# Patient Record
Sex: Male | Born: 1985 | Race: Black or African American | Hispanic: No | Marital: Single | Smoking: Never smoker
Health system: Southern US, Community
[De-identification: ages and names within clinical notes are randomized; demographics above are authoritative.]

## PROBLEM LIST (undated history)

## (undated) ENCOUNTER — Emergency Department (HOSPITAL_COMMUNITY): Admission: EM | Payer: 59 | Source: Home / Self Care

---
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
## 2006-05-13 ENCOUNTER — Emergency Department (HOSPITAL_COMMUNITY): Admission: EM | Admit: 2006-05-13 | Discharge: 2006-05-13 | Payer: Self-pay | Admitting: Emergency Medicine

## 2008-11-30 ENCOUNTER — Emergency Department (HOSPITAL_COMMUNITY): Admission: EM | Admit: 2008-11-30 | Discharge: 2008-11-30 | Payer: Self-pay | Admitting: Emergency Medicine

## 2009-09-08 ENCOUNTER — Emergency Department (HOSPITAL_COMMUNITY): Admission: EM | Admit: 2009-09-08 | Discharge: 2009-09-08 | Payer: Self-pay | Admitting: Emergency Medicine

## 2009-11-08 ENCOUNTER — Ambulatory Visit: Payer: Self-pay | Admitting: Family Medicine

## 2009-11-08 ENCOUNTER — Inpatient Hospital Stay (HOSPITAL_COMMUNITY): Admission: EM | Admit: 2009-11-08 | Discharge: 2009-11-11 | Payer: Self-pay | Admitting: Emergency Medicine

## 2009-11-09 ENCOUNTER — Ambulatory Visit: Payer: Self-pay | Admitting: Internal Medicine

## 2009-11-26 ENCOUNTER — Ambulatory Visit: Payer: Self-pay | Admitting: Family Medicine

## 2009-11-26 DIAGNOSIS — D509 Iron deficiency anemia, unspecified: Secondary | ICD-10-CM | POA: Insufficient documentation

## 2009-11-26 DIAGNOSIS — K260 Acute duodenal ulcer with hemorrhage: Secondary | ICD-10-CM | POA: Insufficient documentation

## 2010-06-18 NOTE — Assessment & Plan Note (Signed)
Summary: h/fup,tcb   Vital Signs:  Patient profile:   25 year old male Height:      69.75 inches Weight:      170 pounds BMI:     24.66 Temp:     98.6 degrees F oral Pulse rate:   83 / minute BP sitting:   124 / 74  (left arm) Cuff size:   regular  Vitals Entered By: Tessie Fass CMA (November 26, 2009 2:28 PM) CC: hospital f/u Is Patient Diabetic? No Pain Assessment Patient in pain? no        Primary Care Provider:  Clementeen Gonzalez  CC:  hospital f/u.  History of Present Illness: Bruce Gonzalez presents for hospital follow up and a new patient visit. He was hospitilized for a bleeding duodenal ulcer that resulted in iron deficiency anemia. In the interum he has done well. He has been taking his omeprazole and carafate as directed.  He no longer has abdominal pain and his melena has resolved.  He also notes that his dyspnea on exertion has improved as well.  He has returned to work and feels back to his normal self. He no longer drinks alcohol.    Habits & Providers  Alcohol-Tobacco-Diet     Alcohol drinks/day: 0     Tobacco Status: never  Current Problems (verified): 1)  Acut Duod Ulcer W/hemorr w/o Mention Obstruction  (ICD-532.00) 2)  Anemia, Iron Deficiency  (ICD-280.9)  Current Medications (verified): 1)  Omeprazole 20 Mg Cpdr (Omeprazole) .Marland Kitchen.. 1 Pill By Mouth Daily 2)  Iron 325 (65 Fe) Mg Tabs (Ferrous Sulfate) .Marland Kitchen.. 1 By Mouth Bid  Allergies (verified): No Known Drug Allergies  Past History:  Past Medical History: Duodenal Ulcer - H.Pylori neg 10/2009 Anemia due to blood loss with ulcer 10/2009  Past Surgical History: UGD to diagnose duodenal ulcer 10/2009  Family History: Father with diabetes as an adult.  Social History: No alcohol any longer. No tobacco. Works at [employer] and trying to sign up for [employer]Smoking Status:  never  Review of Systems  The patient denies anorexia, fever, weight loss, hoarseness, chest pain, syncope, dyspnea on  exertion, peripheral edema, headaches, hemoptysis, abdominal pain, melena, hematochezia, severe indigestion/heartburn, and difficulty walking.    Physical Exam  General:  VS noted. Well appearing male in NAD Eyes:  No corneal or conjunctival inflammation noted. EOMI. Perrla.  Mouth:  Oral mucosa and oropharynx without lesions or exudates.  Teeth in good repair. Lungs:  Normal respiratory effort, chest expands symmetrically. Lungs are clear to auscultation, no crackles or wheezes. Heart:  Normal rate and regular rhythm. S1 and S2 normal without gallop, murmur, click, rub or other extra sounds. Abdomen:  Bowel sounds positive,abdomen soft and non-tender without masses, organomegaly or hernias noted. Extremities:  Non edemetus, warm and well perfused.   Impression & Recommendations:  Problem # 1:  ACUT DUOD ULCER W/HEMORR W/O MENTION OBSTRUCTION (ICD-532.00) Assessment Improved Improved from hospitilization. Asymptomatic. Taking medications as directed. Has stoped drinking alcohol.  Plan to change to daily omeprazole for the next few monhts and encourage alcohol moderation.  Will f/u in 6 months.   His updated medication list for this problem includes:    Omeprazole 20 Mg Cpdr (Omeprazole) .Marland Kitchen... 1 pill by mouth daily  Problem # 2:  ANEMIA, IRON DEFICIENCY (ICD-280.9) Assessment: Improved Hb at 9.2 up from 8.0 at discharge.  Plan to start two times a day IRON today.  Will stop after 3months. Will f/u in 6 months with  repeat Hb or CBC.   His updated medication list for this problem includes:    Iron 325 (65 Fe) Mg Tabs (Ferrous sulfate) .Marland Kitchen... 1 by mouth bid  Orders: [facility] ([address])  Complete Medication List: 1)  Omeprazole 20 Mg Cpdr (Omeprazole) .Marland Kitchen.. 1 pill by mouth daily 2)  Iron 325 (65 Fe) Mg Tabs (Ferrous sulfate) .Marland Kitchen.. 1 by mouth bid  Patient Instructions: 1)  Thank you for seeing me today. 2)  I will call you with results. [phone] and leave a message. 3)  Take  omeprazole 1x daily. 4)  Take iron twice a day for 3 months. 5)  You may use colace (stool softner) for constipation with iron. 6)  Make an appointment to see me in 6 months. Prescriptions: OMEPRAZOLE 20 MG CPDR (OMEPRAZOLE) 1 pill by mouth daily  #30 x 6   Entered and Authorized by:   Bruce Graham MD   Signed by:   Bruce Graham MD on 11/26/2009   Method used:   Print then Give to Patient   RxID:   [id number]   Laboratory Results   Blood Tests   Date/Time Received: November 26, 2009 2:55 PM  Date/Time Reported: November 26, 2009 2:57 PM     CBC   HGB:  9.2 g/dL   (Normal Range: 78.4-69.6 in Males, 12.0-15.0 in Females) Comments: ...........test performed by...........Marland KitchenTerese Door, CMA

## 2010-06-18 NOTE — Procedures (Signed)
Summary: Upper Endoscopy  Patient: [name] Note: All result statuses are Final unless otherwise noted.  Tests: (1) Upper Endoscopy (EGD)   EGD Upper Endoscopy       DONE     [facility]     [address]     [place], [place]  [address]           ENDOSCOPY PROCEDURE REPORT           PATIENT:  Bruce Gonzalez, Bruce Gonzalez  MR#:  [id number]     BIRTHDATE:  1985/07/18, 23 yrs. old  GENDER:  male           ENDOSCOPIST:  Wilhemina Bonito. Eda Keys, MD     Referred by:  Estill Batten. Deirdre Priest, M.D.           PROCEDURE DATE:  11/09/2009     PROCEDURE:  EGD with biopsy,     EGD for control of bleeding     ASA CLASS:  Class I     INDICATIONS:  melena           MEDICATIONS:   Fentanyl 100 mcg IV, Versed 8 mg IV, Epinephrine     3.5 cc     TOPICAL ANESTHETIC:  Cetacaine Spray           DESCRIPTION OF PROCEDURE:   After the risks benefits and     alternatives of the procedure were thoroughly explained, informed     consent was obtained.  The EG-[id number] ([id number]) endoscope was     introduced through the mouth and advanced to the second portion of     the duodenum, without limitations.  The instrument was slowly     withdrawn as the mucosa was fully examined.     <<PROCEDUREIMAGES>>           The upper, middle, and distal third of the esophagus were     carefully inspected and no abnormalities were noted. The z-line     was well seen at the GEJ. The endoscope was pushed into the fundus     which was normal including a retroflexed view. The antrum and     gastric body were unremarkable.  A 15mm ulcer was found in the     bulb / descending duodenum junction. No active bleeding or obvious     stigmata, though clear evaluation of all dimentions of the ulcer     was quite difficult due to deformed anatomy, marked edema, and     angulation. THERAPY: 3.5 cc {1:10,000} epinephrine was injected     into the firm ulcer in several areas. CLO bx taken from antrum.     Retroflexed views revealed no  abnormalities.    The scope was then     withdrawn from the patient and the procedure completed.     COMPLICATIONS:  None           ENDOSCOPIC IMPRESSION:           1) Ulcer in the bulb/descending duodenum - s/p epi injection     therapy and clo bx           RECOMMENDATIONS:     1) Continue IV PPI     2) Rx CLO if positive     3) H. pylori ab     4) no NSAIDS     5) Clears           ______________________________     Wilhemina Bonito. Eda Keys,  MD           CC:  Coralee North MD, Jeani Hawking, MD           n.     eSIGNED:   Wilhemina Bonito. Eda Keys at 11/09/2009 11:21 AM           Rufina Falco, [id number]  Note: An exclamation mark (!) indicates a result that was not dispersed into the flowsheet. Document Creation Date: 11/10/2009 9:26 AM _______________________________________________________________________  (1) Order result status: Final Collection or observation date-time: 11/09/2009 11:00 Requested date-time:  Receipt date-time:  Reported date-time:  Referring Physician:   Ordering Physician: Fransico Setters [phone]) Specimen Source:  Source: Launa Grill Order Number: [id number] Lab site:

## 2010-08-02 LAB — POCT I-STAT, CHEM 8
BUN: 28 mg/dL — ABNORMAL HIGH (ref 6–23)
Calcium, Ion: 1.2 mmol/L (ref 1.12–1.32)
Chloride: 108 mEq/L (ref 96–112)
Creatinine, Ser: 1.1 mg/dL (ref 0.4–1.5)
Glucose, Bld: 86 mg/dL (ref 70–99)
HCT: 28 % — ABNORMAL LOW (ref 39.0–52.0)
Hemoglobin: 9.5 g/dL — ABNORMAL LOW (ref 13.0–17.0)
Potassium: 4 mEq/L (ref 3.5–5.1)
Sodium: 139 mEq/L (ref 135–145)
TCO2: 24 mmol/L (ref 0–100)

## 2010-08-02 LAB — BASIC METABOLIC PANEL
BUN: 5 mg/dL — ABNORMAL LOW (ref 6–23)
CO2: 26 mEq/L (ref 19–32)
Chloride: 108 mEq/L (ref 96–112)
GFR calc non Af Amer: >60 mL/min (ref >60)
Glucose, Bld: 101 mg/dL — ABNORMAL HIGH (ref 70–99)
Potassium: 3.6 mEq/L (ref 3.5–5.1)
Sodium: 141 mEq/L (ref 135–145)

## 2010-08-02 LAB — BILIRUBIN, FRACTIONATED(TOT/DIR/INDIR)
Bilirubin, Direct: 0.4 mg/dL — ABNORMAL HIGH (ref 0.0–0.3)
Indirect Bilirubin: 1.7 mg/dL — ABNORMAL HIGH (ref 0.3–0.9)
Total Bilirubin: 2.1 mg/dL — ABNORMAL HIGH (ref 0.3–1.2)

## 2010-08-02 LAB — COMPREHENSIVE METABOLIC PANEL
ALT: 18 U/L (ref 0–53)
AST: 17 U/L (ref 0–37)
AST: 20 U/L (ref 0–37)
Albumin: 3.3 g/dL — ABNORMAL LOW (ref 3.5–5.2)
Albumin: 3.8 g/dL (ref 3.5–5.2)
Alkaline Phosphatase: 52 U/L (ref 39–117)
BUN: 27 mg/dL — ABNORMAL HIGH (ref 6–23)
BUN: 5 mg/dL — ABNORMAL LOW (ref 6–23)
CO2: 25 mEq/L (ref 19–32)
Calcium: 8.5 mg/dL (ref 8.4–10.5)
Calcium: 9.3 mg/dL (ref 8.4–10.5)
Chloride: 108 mEq/L (ref 96–112)
Creatinine, Ser: 1.1 mg/dL (ref 0.4–1.5)
Creatinine, Ser: 1.12 mg/dL (ref 0.4–1.5)
GFR calc Af Amer: >60 mL/min (ref >60)
GFR calc Af Amer: >60 mL/min (ref >60)
GFR calc non Af Amer: >60 mL/min (ref >60)
Glucose, Bld: 79 mg/dL (ref 70–99)
Potassium: 3.8 mEq/L (ref 3.5–5.1)
Sodium: 138 mEq/L (ref 135–145)
Total Bilirubin: 1.4 mg/dL — ABNORMAL HIGH (ref 0.3–1.2)
Total Protein: 5.9 g/dL — ABNORMAL LOW (ref 6.0–8.3)
Total Protein: 6.8 g/dL (ref 6.0–8.3)

## 2010-08-02 LAB — DIFFERENTIAL
Eosinophils Relative: 1 % (ref 0–5)
Lymphocytes Relative: 25 % (ref 12–46)
Lymphs Abs: 3 10*3/uL (ref 0.7–4.0)
Neutro Abs: 7.5 10*3/uL (ref 1.7–7.7)

## 2010-08-02 LAB — TYPE AND SCREEN
ABO/RH(D): O POS
Antibody Screen: NEGATIVE

## 2010-08-02 LAB — CBC
HCT: 24.5 % — ABNORMAL LOW (ref 39.0–52.0)
HCT: 25.8 % — ABNORMAL LOW (ref 39.0–52.0)
Hemoglobin: 8.1 g/dL — ABNORMAL LOW (ref 13.0–17.0)
Hemoglobin: 8.3 g/dL — ABNORMAL LOW (ref 13.0–17.0)
MCH: 25.6 pg — ABNORMAL LOW (ref 26.0–34.0)
MCH: 26 pg (ref 26.0–34.0)
MCHC: 32.1 g/dL (ref 30.0–36.0)
MCHC: 32.4 g/dL (ref 30.0–36.0)
MCV: 76.9 fL — ABNORMAL LOW (ref 78.0–100.0)
MCV: 79.5 fL (ref 78.0–100.0)
MCV: 79.9 fL (ref 78.0–100.0)
Platelets: 271 10*3/uL (ref 150–400)
Platelets: 345 10*3/uL (ref 150–400)
RBC: 3.14 MIL/uL — ABNORMAL LOW (ref 4.22–5.81)
RBC: 3.18 MIL/uL — ABNORMAL LOW (ref 4.22–5.81)
RDW: 18.6 % — ABNORMAL HIGH (ref 11.5–15.5)
RDW: 18.9 % — ABNORMAL HIGH (ref 11.5–15.5)
WBC: 11.8 10*3/uL — ABNORMAL HIGH (ref 4.0–10.5)
WBC: 8.9 10*3/uL (ref 4.0–10.5)
WBC: 9.6 10*3/uL (ref 4.0–10.5)

## 2010-08-02 LAB — LIPASE, BLOOD: Lipase: 26 U/L (ref 11–59)

## 2010-08-02 LAB — CLOTEST (H. PYLORI), BIOPSY: Helicobacter screen: NEGATIVE

## 2010-08-02 LAB — APTT: aPTT: 25 seconds (ref 24–37)

## 2010-08-02 LAB — HEMOCCULT GUIAC POC 1CARD (OFFICE): Fecal Occult Bld: POSITIVE

## 2010-08-02 LAB — LACTIC ACID, PLASMA: Lactic Acid, Venous: 0.9 mmol/L (ref 0.5–2.2)

## 2010-08-02 LAB — PROTIME-INR
INR: 1.14 (ref 0.00–1.49)
Prothrombin Time: 14.5 seconds (ref 11.6–15.2)

## 2010-08-02 LAB — ABO/RH: ABO/RH(D): O POS

## 2010-08-04 LAB — COMPREHENSIVE METABOLIC PANEL
Alkaline Phosphatase: 50 U/L (ref 39–117)
BUN: 37 mg/dL — ABNORMAL HIGH (ref 6–23)
Calcium: 8.9 mg/dL (ref 8.4–10.5)
Glucose, Bld: 96 mg/dL (ref 70–99)
Total Protein: 6.9 g/dL (ref 6.0–8.3)

## 2010-08-04 LAB — CBC
HCT: 34.4 % — ABNORMAL LOW (ref 39.0–52.0)
MCHC: 35.2 g/dL (ref 30.0–36.0)
MCV: 94.5 fL (ref 78.0–100.0)
Platelets: 249 10*3/uL (ref 150–400)
WBC: 17.4 10*3/uL — ABNORMAL HIGH (ref 4.0–10.5)

## 2010-08-04 LAB — DIFFERENTIAL
Eosinophils Absolute: 0.2 10*3/uL (ref 0.0–0.7)
Eosinophils Relative: 1 % (ref 0–5)
Lymphs Abs: 1.8 10*3/uL (ref 0.7–4.0)
Monocytes Relative: 6 % (ref 3–12)

## 2010-08-04 LAB — URINALYSIS, ROUTINE W REFLEX MICROSCOPIC
Bilirubin Urine: NEGATIVE
Glucose, UA: NEGATIVE mg/dL
Hgb urine dipstick: NEGATIVE
Protein, ur: NEGATIVE mg/dL

## 2010-08-04 LAB — POCT I-STAT, CHEM 8
Calcium, Ion: 1.19 mmol/L (ref 1.12–1.32)
Chloride: 106 mEq/L (ref 96–112)
HCT: 36 % — ABNORMAL LOW (ref 39.0–52.0)
Potassium: 4.3 mEq/L (ref 3.5–5.1)

## 2010-08-04 LAB — HEMOCCULT GUIAC POC 1CARD (OFFICE): Fecal Occult Bld: POSITIVE

## 2010-08-04 LAB — LIPASE, BLOOD: Lipase: 24 U/L (ref 11–59)

## 2010-08-23 LAB — GC/CHLAMYDIA PROBE AMP, GENITAL: GC Probe Amp, Genital: NEGATIVE

## 2010-10-12 ENCOUNTER — Emergency Department (HOSPITAL_COMMUNITY)
Admission: EM | Admit: 2010-10-12 | Discharge: 2010-10-13 | Disposition: A | Discharge status: Home / Self Care | Payer: 59 | Attending: Emergency Medicine | Admitting: Emergency Medicine

## 2010-10-12 DIAGNOSIS — S8010XA Contusion of unspecified lower leg, initial encounter: Secondary | ICD-10-CM | POA: Insufficient documentation

## 2010-10-12 DIAGNOSIS — M79609 Pain in unspecified limb: Secondary | ICD-10-CM | POA: Insufficient documentation

## 2010-10-12 DIAGNOSIS — IMO0002 Reserved for concepts with insufficient information to code with codable children: Secondary | ICD-10-CM | POA: Insufficient documentation

## 2010-10-12 DIAGNOSIS — M25569 Pain in unspecified knee: Secondary | ICD-10-CM | POA: Insufficient documentation

## 2010-10-13 ENCOUNTER — Emergency Department (HOSPITAL_COMMUNITY): Payer: 59

## 2011-02-21 ENCOUNTER — Emergency Department (HOSPITAL_COMMUNITY)
Admission: EM | Admit: 2011-02-21 | Discharge: 2011-02-21 | Disposition: A | Discharge status: Home / Self Care | Payer: No Typology Code available for payment source | Attending: Emergency Medicine | Admitting: Emergency Medicine

## 2011-02-21 DIAGNOSIS — M25519 Pain in unspecified shoulder: Secondary | ICD-10-CM | POA: Insufficient documentation

## 2011-02-21 DIAGNOSIS — M62838 Other muscle spasm: Secondary | ICD-10-CM | POA: Insufficient documentation

## 2015-05-07 ENCOUNTER — Emergency Department (HOSPITAL_COMMUNITY)
Admission: EM | Admit: 2015-05-07 | Discharge: 2015-05-07 | Disposition: A | Discharge status: Home / Self Care | Payer: 59 | Attending: Emergency Medicine | Admitting: Emergency Medicine

## 2015-05-07 ENCOUNTER — Emergency Department (HOSPITAL_COMMUNITY): Payer: 59

## 2015-05-07 ENCOUNTER — Emergency Department (HOSPITAL_COMMUNITY)
Admission: EM | Admit: 2015-05-07 | Discharge: 2015-05-07 | Disposition: A | Discharge status: Nursing Home (Includes ICF) | Payer: 59 | Source: Home / Self Care | Attending: Family Medicine | Admitting: Family Medicine

## 2015-05-07 ENCOUNTER — Encounter (HOSPITAL_COMMUNITY): Payer: Self-pay | Admitting: Emergency Medicine

## 2015-05-07 DIAGNOSIS — M545 Low back pain, unspecified: Secondary | ICD-10-CM

## 2015-05-07 DIAGNOSIS — M549 Dorsalgia, unspecified: Secondary | ICD-10-CM

## 2015-05-07 DIAGNOSIS — R630 Anorexia: Secondary | ICD-10-CM | POA: Insufficient documentation

## 2015-05-07 DIAGNOSIS — G4489 Other headache syndrome: Secondary | ICD-10-CM

## 2015-05-07 DIAGNOSIS — R509 Fever, unspecified: Secondary | ICD-10-CM

## 2015-05-07 DIAGNOSIS — D729 Disorder of white blood cells, unspecified: Secondary | ICD-10-CM

## 2015-05-07 DIAGNOSIS — R531 Weakness: Secondary | ICD-10-CM | POA: Insufficient documentation

## 2015-05-07 DIAGNOSIS — R5383 Other fatigue: Secondary | ICD-10-CM | POA: Diagnosis not present

## 2015-05-07 DIAGNOSIS — R197 Diarrhea, unspecified: Secondary | ICD-10-CM | POA: Diagnosis not present

## 2015-05-07 LAB — URINALYSIS, ROUTINE W REFLEX MICROSCOPIC
Bilirubin Urine: NEGATIVE
GLUCOSE, UA: NEGATIVE mg/dL
Hgb urine dipstick: NEGATIVE
KETONES UR: 15 mg/dL — AB
LEUKOCYTES UA: NEGATIVE
NITRITE: NEGATIVE
PH: 5.5 (ref 5.0–8.0)
Protein, ur: NEGATIVE mg/dL
SPECIFIC GRAVITY, URINE: 1.027 (ref 1.005–1.030)

## 2015-05-07 LAB — COMPREHENSIVE METABOLIC PANEL
ALK PHOS: 69 U/L (ref 38–126)
ALT: 51 U/L (ref 17–63)
AST: 35 U/L (ref 15–41)
Albumin: 3.4 g/dL — ABNORMAL LOW (ref 3.5–5.0)
Anion gap: 10 (ref 5–15)
BILIRUBIN TOTAL: 1.4 mg/dL — AB (ref 0.3–1.2)
BUN: 6 mg/dL (ref 6–20)
CALCIUM: 8.8 mg/dL — AB (ref 8.9–10.3)
CO2: 21 mmol/L — ABNORMAL LOW (ref 22–32)
CREATININE: 1.19 mg/dL (ref 0.61–1.24)
Chloride: 105 mmol/L (ref 101–111)
Glucose, Bld: 85 mg/dL (ref 65–99)
Potassium: 3.7 mmol/L (ref 3.5–5.1)
Sodium: 136 mmol/L (ref 135–145)
Total Protein: 6.8 g/dL (ref 6.5–8.1)

## 2015-05-07 LAB — CBC WITH DIFFERENTIAL/PLATELET
BASOS ABS: 0 10*3/uL (ref 0.0–0.1)
BASOS PCT: 0 %
Eosinophils Absolute: 0 10*3/uL (ref 0.0–0.7)
Eosinophils Relative: 0 %
HEMATOCRIT: 47.1 % (ref 39.0–52.0)
HEMOGLOBIN: 15.7 g/dL (ref 13.0–17.0)
Lymphocytes Relative: 13 %
Lymphs Abs: 1.9 10*3/uL (ref 0.7–4.0)
MCH: 31.5 pg (ref 26.0–34.0)
MCHC: 33.3 g/dL (ref 30.0–36.0)
MCV: 94.4 fL (ref 78.0–100.0)
MONOS PCT: 11 %
Monocytes Absolute: 1.5 10*3/uL — ABNORMAL HIGH (ref 0.1–1.0)
NEUTROS ABS: 10.6 10*3/uL — AB (ref 1.7–7.7)
NEUTROS PCT: 76 %
Platelets: 185 10*3/uL (ref 150–400)
RBC: 4.99 MIL/uL (ref 4.22–5.81)
RDW: 12.3 % (ref 11.5–15.5)
WBC: 14 10*3/uL — ABNORMAL HIGH (ref 4.0–10.5)

## 2015-05-07 LAB — CBC
HEMATOCRIT: 48.2 % (ref 39.0–52.0)
HEMOGLOBIN: 16.5 g/dL (ref 13.0–17.0)
MCH: 32 pg (ref 26.0–34.0)
MCHC: 34.2 g/dL (ref 30.0–36.0)
MCV: 93.6 fL (ref 78.0–100.0)
Platelets: 182 10*3/uL (ref 150–400)
RBC: 5.15 MIL/uL (ref 4.22–5.81)
RDW: 12.2 % (ref 11.5–15.5)
WBC: 15.1 10*3/uL — ABNORMAL HIGH (ref 4.0–10.5)

## 2015-05-07 LAB — POCT I-STAT, CHEM 8
BUN: 10 mg/dL (ref 6–20)
CALCIUM ION: 1.15 mmol/L (ref 1.12–1.23)
CHLORIDE: 98 mmol/L — AB (ref 101–111)
Creatinine, Ser: 1.4 mg/dL — ABNORMAL HIGH (ref 0.61–1.24)
Glucose, Bld: 119 mg/dL — ABNORMAL HIGH (ref 65–99)
HCT: 52 % (ref 39.0–52.0)
Hemoglobin: 17.7 g/dL — ABNORMAL HIGH (ref 13.0–17.0)
POTASSIUM: 3.9 mmol/L (ref 3.5–5.1)
SODIUM: 136 mmol/L (ref 135–145)
TCO2: 27 mmol/L (ref 0–100)

## 2015-05-07 LAB — POCT URINALYSIS DIP (DEVICE)
Bilirubin Urine: NEGATIVE
GLUCOSE, UA: NEGATIVE mg/dL
Hgb urine dipstick: NEGATIVE
Ketones, ur: NEGATIVE mg/dL
LEUKOCYTES UA: NEGATIVE
Nitrite: NEGATIVE
Protein, ur: 100 mg/dL — AB
SPECIFIC GRAVITY, URINE: 1.02 (ref 1.005–1.030)
pH: 7 (ref 5.0–8.0)

## 2015-05-07 LAB — LACTIC ACID, PLASMA
LACTIC ACID, VENOUS: 1.1 mmol/L (ref 0.5–2.0)
Lactic Acid, Venous: 1 mmol/L (ref 0.5–2.0)

## 2015-05-07 LAB — POCT RAPID STREP A: STREPTOCOCCUS, GROUP A SCREEN (DIRECT): NEGATIVE

## 2015-05-07 MED ORDER — KETOROLAC TROMETHAMINE 30 MG/ML IJ SOLN: 30.0000 mg | Freq: Once | INTRAMUSCULAR | Status: DC

## 2015-05-07 MED ORDER — DIAZEPAM 5 MG PO TABS
5.0000 mg | ORAL_TABLET | Freq: Once | ORAL | Status: AC
  Administered 2015-05-07: 5 mg via ORAL
  Filled 2015-05-07: qty 1

## 2015-05-07 MED ORDER — IBUPROFEN 800 MG PO TABS: 800.0000 mg | ORAL_TABLET | Freq: Three times a day (TID) | ORAL | Status: DC

## 2015-05-07 MED ORDER — KETOROLAC TROMETHAMINE 60 MG/2ML IM SOLN: 60.0000 mg | Freq: Once | INTRAMUSCULAR | Status: DC

## 2015-05-07 MED ORDER — ACETAMINOPHEN 325 MG PO TABS
ORAL_TABLET | ORAL | Status: AC
  Filled 2015-05-07: qty 2

## 2015-05-07 MED ORDER — KETOROLAC TROMETHAMINE 30 MG/ML IJ SOLN
INTRAMUSCULAR | Status: AC
  Filled 2015-05-07: qty 1

## 2015-05-07 MED ORDER — HYDROCODONE-ACETAMINOPHEN 5-325 MG PO TABS
1.0000 | ORAL_TABLET | Freq: Once | ORAL | Status: AC
  Administered 2015-05-07: 1 via ORAL

## 2015-05-07 MED ORDER — GADOBENATE DIMEGLUMINE 529 MG/ML IV SOLN
16.0000 mL | Freq: Once | INTRAVENOUS | Status: AC | PRN
  Administered 2015-05-07: 16 mL via INTRAVENOUS

## 2015-05-07 MED ORDER — HYDROMORPHONE HCL 1 MG/ML IJ SOLN
1.0000 mg | Freq: Once | INTRAMUSCULAR | Status: AC
  Administered 2015-05-07: 1 mg via INTRAVENOUS
  Filled 2015-05-07: qty 1

## 2015-05-07 MED ORDER — SODIUM CHLORIDE 0.9 % IV SOLN
Freq: Once | INTRAVENOUS | Status: AC
  Administered 2015-05-07: 16:00:00 via INTRAVENOUS

## 2015-05-07 MED ORDER — METHOCARBAMOL 500 MG PO TABS: 500.0000 mg | ORAL_TABLET | Freq: Two times a day (BID) | ORAL | Status: DC

## 2015-05-07 MED ORDER — HYDROCODONE-ACETAMINOPHEN 5-325 MG PO TABS
ORAL_TABLET | ORAL | Status: AC
  Filled 2015-05-07: qty 1

## 2015-05-07 MED ORDER — ACETAMINOPHEN 325 MG PO TABS
650.0000 mg | ORAL_TABLET | Freq: Once | ORAL | Status: AC
  Administered 2015-05-07: 650 mg via ORAL

## 2015-05-07 NOTE — ED Provider Notes (Signed)
CSN: [id number]     Arrival date & time 05/07/15  1302 History   First MD Initiated Contact with Patient 05/07/15 1348     Chief Complaint  Patient presents with  . URI   (Consider location/radiation/quality/duration/timing/severity/associated sxs/prior Treatment) HPI Comments: 29 year old male is complaining of moderate to severe low mid back pain over the lumbar spine that radiates to his legs. This includes his thighs and knees and lower legs. Movement and position affect the pain. He is also complaining of a headache and fever. He has not taken his temperature at home. Occasionally has shortness of breath and a rare cough. He denies any known injury to his back. No known work, pulling or straining that may have caused the injury. He denies focal paresthesias. He has had one loose stool while in the urgent care. No recent history of diarrhea. Symptoms occurred rather suddenly at 10 to 10:30 PM 48 hours ago. His temperature currently is 102.8. He complains of weakness, malaise and diminished appetite. There is no nausea or vomiting and he has been able to drink plenty of fluids. Denies urinary symptoms. 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  Patient is a 29 y.o. male presenting with URI.  URI Presenting symptoms: fatigue   Presenting symptoms: no congestion   Associated symptoms: headaches and myalgias   Associated symptoms: no neck pain and no wheezing     History reviewed. No pertinent past medical history. History reviewed. No pertinent past surgical history. No family history on file. Social History  Substance Use Topics  . Smoking status: Never Smoker   . Smokeless tobacco: None  . Alcohol Use: No    Review of Systems  Constitutional: Positive for activity change, appetite change and fatigue.  HENT: Negative for congestion, ear discharge, postnasal drip, sinus pressure and trouble swallowing.   Respiratory: Negative for chest  tightness, shortness of breath and wheezing.   Cardiovascular: Negative for chest pain and leg swelling.  Gastrointestinal: Negative for nausea, vomiting and abdominal pain.  Genitourinary: Negative for dysuria, frequency, discharge and testicular pain.  Musculoskeletal: Positive for myalgias and back pain. Negative for joint swelling, neck pain and neck stiffness.  Skin: Negative.   Neurological: Positive for weakness and headaches. Negative for syncope, facial asymmetry and numbness.  Psychiatric/Behavioral: Negative for behavioral problems and confusion. The patient is not hyperactive.     Allergies  Review of patient's allergies indicates no known allergies.  Home Medications   Prior to Admission medications   Not on File   Meds Ordered and Administered this Visit   Medications  acetaminophen (TYLENOL) tablet 650 mg (650 mg Oral Given 05/07/15 1526)  0.9 %  sodium chloride infusion ( Intravenous New Bag/Given 05/07/15 1538)  HYDROcodone-acetaminophen (NORCO/VICODIN) 5-325 MG per tablet 1 tablet (1 tablet Oral Given 05/07/15 1527)    BP 124/71 mmHg  Pulse 131  Temp(Src) 102.8 F (39.3 C) (Oral)  Resp 16  SpO2 98% No data found.   Physical Exam  Constitutional: He is oriented to person, place, and time. He appears well-developed and well-nourished. No distress.  HENT:  Mouth/Throat: No oropharyngeal exudate.  Bilateral TMs are normal Oropharynx with erythema.  Eyes: Conjunctivae and EOM are normal.  Neck: Normal range of motion. Neck supple.  Cardiovascular: Regular rhythm and normal heart sounds.   Apical tachycardia  Pulmonary/Chest: Effort normal and breath sounds normal. No respiratory distress. He has no wheezes. He has no rales.  Abdominal: Soft. There  is no tenderness.  Musculoskeletal: He exhibits no edema.  No spinal tenderness however the pain in the back originates in the mid to lower lumbar spine and radiates to the legs. He is complaining of pain across  the lower back the muscles of the thigh and lower leg as well as the knees and ankles.  Lymphadenopathy:    He has no cervical adenopathy.  Neurological: He is alert and oriented to person, place, and time. No cranial nerve deficit. He exhibits normal muscle tone.  Skin: Skin is warm and dry.  Psychiatric: He has a normal mood and affect.  Nursing note and vitals reviewed.   ED Course  Procedures (including critical care time)  Labs Review Labs Reviewed  CBC WITH DIFFERENTIAL/PLATELET - Abnormal; Notable for the following:    WBC 14.0 (*)    Neutro Abs 10.6 (*)    Monocytes Absolute 1.5 (*)    All other components within normal limits  POCT I-STAT, CHEM 8 - Abnormal; Notable for the following:    Chloride 98 (*)    Creatinine, Ser 1.40 (*)    Glucose, Bld 119 (*)    Hemoglobin 17.7 (*)    All other components within normal limits  POCT URINALYSIS DIP (DEVICE) - Abnormal; Notable for the following:    Protein, ur 100 (*)    All other components within normal limits  CULTURE, GROUP A STREP  POCT RAPID STREP A    Imaging Review No results found.   Visual Acuity Review  Right Eye Distance:   Left Eye Distance:   Bilateral Distance:    Right Eye Near:   Left Eye Near:    Bilateral Near:         MDM   1. Other specified fever   2. Midline low back pain without sciatica   3. Other headache syndrome   4. Neutrophilic leukocytosis    This is a 29 year old male with moderate to severe mid low back pain radiates into his bilateral legs. This is associated with a fever of 102.8 and a headache. 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 WBC count is mildly elevated to 14,000 with an increase  absolute neutrophils. Creatinine 1.4. Uncertain as to the etiology of these symptoms. We will transfer to the ED for additional evaluation via [facility].    Hayden Rasmussenavid Shontay Wallner, NP 05/07/15 [phone]

## 2015-05-07 NOTE — Discharge Instructions (Signed)
Please read and follow all provided instructions.  Your diagnoses today include:  1. Back pain     Tests performed today include:  Vital signs - see below for your results today  Medications prescribed:   Take any prescribed medications only as directed.  Home care instructions:   Follow any educational materials contained in this packet  Please rest, use ice or heat on your back for the next several days  Do not lift, push, pull anything more than 10 pounds for the next week  Follow-up instructions: Please follow-up with your primary care provider in the next 1 week for further evaluation of your symptoms.   Return instructions:  SEEK IMMEDIATE MEDICAL ATTENTION IF YOU HAVE:  New numbness, tingling, weakness, or problem with the use of your arms or legs  Severe back pain not relieved with medications  Loss control of your bowels or bladder  Increasing pain in any areas of the body (such as chest or abdominal pain)  Shortness of breath, dizziness, or fainting.   Worsening nausea (feeling sick to your stomach), vomiting, fever, or sweats  Any other emergent concerns regarding your health   Additional Information:  Your vital signs today were: BP 125/90 mmHg   Pulse 101   Temp(Src) 100.1 F (37.8 C) (Oral)   Resp 21   SpO2 99% If your blood pressure (BP) was elevated above 135/85 this visit, please have this repeated by your doctor within one month. --------------

## 2015-05-07 NOTE — ED Provider Notes (Signed)
Patient signed out at end of shift by Audry Piliyler Mohr, PA-C, after evaluation for back pain/fever. MRI performed to r/o epidural abscess - negative. UA pending. Plan: if negative d/c home; if positive patient can be d/ch home with abx.  UA resulted and is negative for infection. Patient appears comfortable on re-evaluation. Vital signs improved. He can be discharged home recommending supportive management for febrile illness. Return precautions discussed.   Elpidio AnisShari Kalynn Declercq, PA-C 05/07/15 2142  Lorre NickAnthony Allen, MD 05/07/15 [phone]

## 2015-05-07 NOTE — ED Notes (Signed)
Pt is requesting prescriptions for his sore throat and his back pain. He states tylenol and ibuprofen are not helping. Melvenia BeamShari, PA informed and will go back to bedside to talk with patient.

## 2015-05-07 NOTE — ED Notes (Signed)
Pt reports he is missing his silver colored nose ring. The nose ring was placed in urine specimen contained but was somehow unknowingly misplaced before pt was discharged. Pt stated that it was okay and that he would just purchase a new one.

## 2015-05-07 NOTE — ED Provider Notes (Signed)
CSN: [id number]     Arrival date & time 05/07/15  1628 History   First MD Initiated Contact with Patient 05/07/15 1635     Chief Complaint  Patient presents with  . Back Pain  . Fever   (Consider location/radiation/quality/duration/timing/severity/associated sxs/prior Treatment) HPI 29 y.o. male with no sig pmh, presents to the Emergency Department today complaining of back pain since Tuesday. States that he developed a cold on Monday with a fever, diarrhea, lack of appetite,an  Weakness. On Tuesday he woke up from sleep with intense lower back pain "20/10" that radiates towards his anterior legs and is constant. No trauma or lifting noted. Decided to come to the ED on Wednesday after evaluation at Urgent Care with reported fever of 102.60F. No loss of bowel or bladder function.    History reviewed. No pertinent past medical history. No past surgical history on file. No family history on file. Social History  Substance Use Topics  . Smoking status: Never Smoker   . Smokeless tobacco: None  . Alcohol Use: No    Review of Systems  Constitutional: Positive for fever, chills, appetite change and fatigue. Negative for diaphoresis.  HENT: Negative for congestion, sinus pressure, sore throat and tinnitus.   Eyes: Negative for photophobia and visual disturbance.  Respiratory: Negative for cough and shortness of breath.   Cardiovascular: Negative for chest pain.  Gastrointestinal: Negative for nausea, vomiting, abdominal pain, diarrhea and constipation.  Endocrine: Negative for cold intolerance and heat intolerance.  Musculoskeletal: Positive for back pain. Negative for neck pain and neck stiffness.  Skin: Negative for color change.  Neurological: Positive for weakness. Negative for dizziness, syncope, numbness and headaches.   Allergies  Review of patient's allergies indicates no known allergies.  Home Medications   Prior to Admission medications   Medication Sig Start Date End Date  Taking? Authorizing Provider  omeprazole (PRILOSEC) 20 MG capsule Take 20 mg by mouth daily as needed (stomach pain, ulcer).    Yes Historical Provider, MD   BP 115/68 mmHg  Pulse 107  Temp(Src) 100.1 F (37.8 C) (Oral)  Resp 21  SpO2 100%   Physical Exam  Constitutional: He is oriented to person, place, and time. He appears well-developed and well-nourished.  HENT:  Head: Normocephalic and atraumatic.  Right Ear: Hearing and tympanic membrane normal.  Left Ear: Hearing and tympanic membrane normal.  Nose: Nose normal.  Mouth/Throat: Uvula is midline, oropharynx is clear and moist and mucous membranes are normal.  Eyes: Conjunctivae and EOM are normal. Pupils are equal, round, and reactive to light.  Neck: Neck supple.  Cardiovascular: Normal rate and regular rhythm.   Pulmonary/Chest: Effort normal and breath sounds normal.  Abdominal: Soft.  Musculoskeletal:       Cervical back: Normal.       Thoracic back: Normal.       Lumbar back: He exhibits tenderness, bony tenderness and pain.  Neurological: He is alert and oriented to person, place, and time. He has normal strength. No cranial nerve deficit or sensory deficit.  Skin: Skin is warm and dry.  Psychiatric: He has a normal mood and affect. His behavior is normal. Thought content normal.   ED Course  Procedures (including critical care time) Labs Review Labs Reviewed  CBC - Abnormal; Notable for the following:    WBC 15.1 (*)    All other components within normal limits  COMPREHENSIVE METABOLIC PANEL - Abnormal; Notable for the following:    CO2 21 (*)  Calcium 8.8 (*)    Albumin 3.4 (*)    Total Bilirubin 1.4 (*)    All other components within normal limits  CULTURE, BLOOD (ROUTINE X 2)  CULTURE, BLOOD (ROUTINE X 2)  URINE CULTURE  LACTIC ACID, PLASMA  LACTIC ACID, PLASMA  URINALYSIS, ROUTINE W REFLEX MICROSCOPIC (NOT AT [facility])   Imaging Review Mr Thoracic Spine W Wo Contrast  05/07/2015  CLINICAL DATA:  Two  day history of fever and low back pain. Abnormal reflexes. EXAM: MRI THORACIC AND LUMBAR SPINE WITHOUT AND WITH CONTRAST TECHNIQUE: Multiplanar and multiecho pulse sequences of the thoracic and lumbar spine were obtained without and with intravenous contrast. CONTRAST:  16mL MULTIHANCE GADOBENATE DIMEGLUMINE 529 MG/ML IV SOLN COMPARISON:  None. FINDINGS: MR THORACIC SPINE FINDINGS Normal signal is present throughout the thoracic spinal cord. Marrow signal, vertebral body heights, alignment are normal. There is no focal disc protrusions or stenosis. The foramina are patent bilaterally. The paraspinous soft tissues are within normal limits. The postcontrast images demonstrate no pathologic enhancement. MR LUMBAR SPINE FINDINGS Normal signal is present in the conus medullaris which terminates at L1-2, within normal limits. Marrow signal, vertebral body heights, and alignment are normal. Disc signal and height are normal. There is no focal protrusion or stenosis. The foramina are patent. The postcontrast images demonstrate no pathologic enhancement. There is no evidence for infection. Limited imaging of the abdomen is unremarkable. There is no significant adenopathy. IMPRESSION: 1. Negative MRI of the thoracic and lumbar spine without and with contrast. 2. No focal enhancement to suggest infection or abscess. Electronically Signed   By: Marin Roberts M.D.   On: 05/07/2015 19:51   Mr Lumbar Spine W Wo Contrast  05/07/2015  CLINICAL DATA:  Two day history of fever and low back pain. Abnormal reflexes. EXAM: MRI THORACIC AND LUMBAR SPINE WITHOUT AND WITH CONTRAST TECHNIQUE: Multiplanar and multiecho pulse sequences of the thoracic and lumbar spine were obtained without and with intravenous contrast. CONTRAST:  16mL MULTIHANCE GADOBENATE DIMEGLUMINE 529 MG/ML IV SOLN COMPARISON:  None. FINDINGS: MR THORACIC SPINE FINDINGS Normal signal is present throughout the thoracic spinal cord. Marrow signal, vertebral body  heights, alignment are normal. There is no focal disc protrusions or stenosis. The foramina are patent bilaterally. The paraspinous soft tissues are within normal limits. The postcontrast images demonstrate no pathologic enhancement. MR LUMBAR SPINE FINDINGS Normal signal is present in the conus medullaris which terminates at L1-2, within normal limits. Marrow signal, vertebral body heights, and alignment are normal. Disc signal and height are normal. There is no focal protrusion or stenosis. The foramina are patent. The postcontrast images demonstrate no pathologic enhancement. There is no evidence for infection. Limited imaging of the abdomen is unremarkable. There is no significant adenopathy. IMPRESSION: 1. Negative MRI of the thoracic and lumbar spine without and with contrast. 2. No focal enhancement to suggest infection or abscess. Electronically Signed   By: Marin Roberts M.D.   On: 05/07/2015 19:51   I have personally reviewed and evaluated these images and lab results as part of my medical decision-making.   EKG Interpretation None     MDM  I have reviewed relevant laboratory values. I have reviewed relevant imaging studies.I obtained HPI from historian. Cases discussed with Attending Physician  ED Course: CBC, CMP, UA, MR Thoracic/Lumbar, Blood Cx 5:01 PM- Given  Dilaudid for pain   Valium given  Assessment: 29y M with no sig pmh presents with acute lower back pain x 2 days. Fever present.  Pain on palpation of lumbar spine. Concerning for epidural Abscess. Will order MR Lumbar and Thoracic for evaluation. Blood cx ordered along with UA. Leukocytosis at 15.1.   Disposition/Plan:  Will d/c home with pain medication for lower back Additional Verbal discharge instructions given and discussed with patient.  Pt Instructed to f/u with PCP in the next 48 hours for evaluation and treatment of symptoms.   Sign out to: Elpidio AnisShari Upstill, PA-C   Patient was discussed with Lorre NickAnthony  Allen, MD   Final diagnoses:  Back pain  Febrile illness      Audry Piliyler Kennady Zimmerle, PA-C 05/07/15 2248

## 2015-05-07 NOTE — ED Notes (Signed)
C/o BA, back pain, bilateral leg pain, decreased appetite, weakness and fevers A&O x4... No acute distress.

## 2015-05-07 NOTE — ED Notes (Signed)
Shari PA at bedside   

## 2015-05-07 NOTE — ED Notes (Signed)
Gave pt water... Pt unable to void at the moment.

## 2015-05-07 NOTE — ED Notes (Signed)
Pt A&OX4, ambulatory at d/c with steady gait, NAD 

## 2015-05-07 NOTE — ED Notes (Signed)
Per [facility]- pt here from Urgent Care for evaluation of new onset thigh pain, lower back pain and fever. Temp was 102.8 at UC, pt given Tylenol. Pt has PIV 20G to RAC with NS going.

## 2015-05-07 NOTE — ED Provider Notes (Signed)
Medical screening examination/treatment/procedure(s) were conducted as a shared visit with non-physician practitioner(s) and myself.  I personally evaluated the patient during the encounter.   EKG Interpretation None     Two-day history of fever and lower back pain. He denies any cough or congestion. No loss of bowel or bladder function. Denies any IV drug use. No neck pain or meningismus. No photophobia. Seen in urgent care and was sent here for further evaluation. On physical exam, his patellar reflexes are 2+ by laterally. Concern for possible spinal abscess. Will obtain MRI.  Lorre NickAnthony Ahmaad Neidhardt, MD 05/07/15 [phone]

## 2015-05-08 ENCOUNTER — Encounter (HOSPITAL_COMMUNITY): Payer: Self-pay | Admitting: Emergency Medicine

## 2015-05-08 ENCOUNTER — Emergency Department (HOSPITAL_COMMUNITY): Payer: 59

## 2015-05-08 ENCOUNTER — Emergency Department (HOSPITAL_COMMUNITY)
Admission: EM | Admit: 2015-05-08 | Discharge: 2015-05-08 | Disposition: A | Discharge status: Home / Self Care | Payer: 59 | Attending: Emergency Medicine | Admitting: Emergency Medicine

## 2015-05-08 DIAGNOSIS — M545 Low back pain: Secondary | ICD-10-CM | POA: Insufficient documentation

## 2015-05-08 DIAGNOSIS — R531 Weakness: Secondary | ICD-10-CM | POA: Diagnosis not present

## 2015-05-08 DIAGNOSIS — R1084 Generalized abdominal pain: Secondary | ICD-10-CM | POA: Diagnosis not present

## 2015-05-08 DIAGNOSIS — Z79899 Other long term (current) drug therapy: Secondary | ICD-10-CM | POA: Insufficient documentation

## 2015-05-08 DIAGNOSIS — G479 Sleep disorder, unspecified: Secondary | ICD-10-CM | POA: Insufficient documentation

## 2015-05-08 DIAGNOSIS — R509 Fever, unspecified: Secondary | ICD-10-CM | POA: Insufficient documentation

## 2015-05-08 DIAGNOSIS — Z791 Long term (current) use of non-steroidal anti-inflammatories (NSAID): Secondary | ICD-10-CM | POA: Insufficient documentation

## 2015-05-08 DIAGNOSIS — R6889 Other general symptoms and signs: Secondary | ICD-10-CM

## 2015-05-08 DIAGNOSIS — R630 Anorexia: Secondary | ICD-10-CM | POA: Diagnosis not present

## 2015-05-08 DIAGNOSIS — R000 Tachycardia, unspecified: Secondary | ICD-10-CM | POA: Insufficient documentation

## 2015-05-08 DIAGNOSIS — R51 Headache: Secondary | ICD-10-CM | POA: Insufficient documentation

## 2015-05-08 LAB — CBC WITH DIFFERENTIAL/PLATELET
Basophils Absolute: 0 10*3/uL (ref 0.0–0.1)
Basophils Relative: 0 %
Eosinophils Absolute: 0 10*3/uL (ref 0.0–0.7)
Eosinophils Relative: 0 %
HCT: 47.8 % (ref 39.0–52.0)
HEMOGLOBIN: 16.2 g/dL (ref 13.0–17.0)
LYMPHS ABS: 1.9 10*3/uL (ref 0.7–4.0)
LYMPHS PCT: 13 %
MCH: 31.2 pg (ref 26.0–34.0)
MCHC: 33.9 g/dL (ref 30.0–36.0)
MCV: 91.9 fL (ref 78.0–100.0)
Monocytes Absolute: 1.7 10*3/uL — ABNORMAL HIGH (ref 0.1–1.0)
Monocytes Relative: 12 %
NEUTROS ABS: 10.5 10*3/uL — AB (ref 1.7–7.7)
NEUTROS PCT: 75 %
Platelets: 179 10*3/uL (ref 150–400)
RBC: 5.2 MIL/uL (ref 4.22–5.81)
RDW: 12.1 % (ref 11.5–15.5)
WBC: 14.1 10*3/uL — AB (ref 4.0–10.5)

## 2015-05-08 LAB — URINE MICROSCOPIC-ADD ON
Bacteria, UA: NONE SEEN
RBC / HPF: NONE SEEN RBC/hpf (ref 0–5)

## 2015-05-08 LAB — URINALYSIS, ROUTINE W REFLEX MICROSCOPIC
Bilirubin Urine: NEGATIVE
GLUCOSE, UA: NEGATIVE mg/dL
HGB URINE DIPSTICK: NEGATIVE
Ketones, ur: NEGATIVE mg/dL
Leukocytes, UA: NEGATIVE
Nitrite: NEGATIVE
Protein, ur: 30 mg/dL — AB
SPECIFIC GRAVITY, URINE: 1.02 (ref 1.005–1.030)
pH: 7 (ref 5.0–8.0)

## 2015-05-08 LAB — COMPREHENSIVE METABOLIC PANEL
ALT: 43 U/L (ref 17–63)
AST: 31 U/L (ref 15–41)
Albumin: 3.8 g/dL (ref 3.5–5.0)
Alkaline Phosphatase: 78 U/L (ref 38–126)
Anion gap: 12 (ref 5–15)
BUN: 10 mg/dL (ref 6–20)
CHLORIDE: 99 mmol/L — AB (ref 101–111)
CO2: 25 mmol/L (ref 22–32)
Calcium: 9 mg/dL (ref 8.9–10.3)
Creatinine, Ser: 1.35 mg/dL — ABNORMAL HIGH (ref 0.61–1.24)
Glucose, Bld: 105 mg/dL — ABNORMAL HIGH (ref 65–99)
POTASSIUM: 3.9 mmol/L (ref 3.5–5.1)
Sodium: 136 mmol/L (ref 135–145)
Total Bilirubin: 1.4 mg/dL — ABNORMAL HIGH (ref 0.3–1.2)
Total Protein: 7.7 g/dL (ref 6.5–8.1)

## 2015-05-08 LAB — I-STAT CG4 LACTIC ACID, ED
LACTIC ACID, VENOUS: 1.48 mmol/L (ref 0.5–2.0)
Lactic Acid, Venous: 0.59 mmol/L (ref 0.5–2.0)

## 2015-05-08 MED ORDER — METOCLOPRAMIDE HCL 5 MG/ML IJ SOLN
10.0000 mg | Freq: Once | INTRAMUSCULAR | Status: AC
  Administered 2015-05-08: 10 mg via INTRAVENOUS
  Filled 2015-05-08: qty 2

## 2015-05-08 MED ORDER — DIPHENHYDRAMINE HCL 50 MG/ML IJ SOLN
25.0000 mg | Freq: Once | INTRAMUSCULAR | Status: AC
  Administered 2015-05-08: 25 mg via INTRAVENOUS
  Filled 2015-05-08: qty 1

## 2015-05-08 MED ORDER — MORPHINE SULFATE (PF) 4 MG/ML IV SOLN
6.0000 mg | Freq: Once | INTRAVENOUS | Status: AC
  Administered 2015-05-08: 6 mg via INTRAVENOUS
  Filled 2015-05-08: qty 2

## 2015-05-08 MED ORDER — PROMETHAZINE-DM 6.25-15 MG/5ML PO SYRP: 5.0000 mL | ORAL_SOLUTION | Freq: Four times a day (QID) | ORAL | Status: DC | PRN

## 2015-05-08 MED ORDER — SODIUM CHLORIDE 0.9 % IV BOLUS (SEPSIS)
1000.0000 mL | Freq: Once | INTRAVENOUS | Status: AC
  Administered 2015-05-08: 1000 mL via INTRAVENOUS

## 2015-05-08 MED ORDER — ACETAMINOPHEN-CODEINE #3 300-30 MG PO TABS
1.0000 | ORAL_TABLET | Freq: Once | ORAL | Status: AC
  Administered 2015-05-08: 1 via ORAL
  Filled 2015-05-08: qty 1

## 2015-05-08 MED ORDER — ACETAMINOPHEN-CODEINE #3 300-30 MG PO TABS: 1.0000 | ORAL_TABLET | Freq: Four times a day (QID) | ORAL | Status: DC | PRN

## 2015-05-08 NOTE — ED Provider Notes (Signed)
CSN: [id number]     Arrival date & time 05/08/15  1531 History   First MD Initiated Contact with Patient 05/08/15 1554     Chief Complaint  Patient presents with  . Generalized Body Aches  . Fever     (Consider location/radiation/quality/duration/timing/severity/associated sxs/prior Treatment) HPI   Healthy 29 year old male presents with flulike symptoms. The past week patient has had fever, headache, ear pain, sore throat decreased appetite, generalized weakness, body aches, diarrhea as well as low back pain. He was seen initially at urgent care yesterday and subsequently transferred to [facility] ED for this complaint. A thoracic and lumbar MRI was performed to rule out epidural abscess and the result was unremarkable. He also has a normal urine and labs reflecting a leukocytosis with WBC 15 otherwise unremarkable. He was subsequently discharge but returns today because symptom has not abated. Still continued to endorse difficulty sleeping, fever, back pain, body aches and no appetite. Endorse mild abdominal pain, and now having a dry cough. Headache is rated 8 out of 10. No neck pain, worsening sore throat, chest pain, shortness of breath, dysuria, or rash. Patient denies any recent travel or sick contact.  History reviewed. No pertinent past medical history. History reviewed. No pertinent past surgical history. No family history on file. Social History  Substance Use Topics  . Smoking status: Never Smoker   . Smokeless tobacco: None  . Alcohol Use: No    Review of Systems  All other systems reviewed and are negative.     Allergies  Review of patient's allergies indicates no known allergies.  Home Medications   Prior to Admission medications   Medication Sig Start Date End Date Taking? Authorizing Provider  ibuprofen (ADVIL,MOTRIN) 800 MG tablet Take 1 tablet (800 mg total) by mouth 3 (three) times daily. 05/07/15   Elpidio Anis, PA-C  methocarbamol (ROBAXIN) 500 MG tablet  Take 1 tablet (500 mg total) by mouth 2 (two) times daily. 05/07/15   Elpidio Anis, PA-C  omeprazole (PRILOSEC) 20 MG capsule Take 20 mg by mouth daily as needed (stomach pain, ulcer).     Historical Provider, MD   BP 118/92 mmHg  Pulse 142  Temp(Src) 102.9 F (39.4 C) (Oral)  Resp 18  SpO2 96% Physical Exam  Constitutional: He is oriented to person, place, and time. He appears well-developed and well-nourished. No distress.  African-American male, nontoxic in appearance.  HENT:  Head: Atraumatic.  Right Ear: External ear normal.  Left Ear: External ear normal.  Throat: uvula is midline, posterior oropharyngeal erythema but no tonsillar enlargement or exudates.  Eyes: Conjunctivae are normal.  Neck: Neck supple.  Neck with full range of motion, no nuchal rigidity.  Cardiovascular:  Tachycardia without murmurs rubs or gallops  Pulmonary/Chest: Effort normal.  Abdominal: Soft. There is tenderness (Mild diffuse abdominal tenderness without guarding or rebound tenderness.).  Neurological: He is alert and oriented to person, place, and time. He has normal strength. No cranial nerve deficit or sensory deficit. Coordination normal. GCS eye subscore is 4. GCS verbal subscore is 5. GCS motor subscore is 6.  Skin: No rash noted.  Psychiatric: He has a normal mood and affect.  Nursing note and vitals reviewed.   ED Course  Procedures (including critical care time) Labs Review Labs Reviewed  COMPREHENSIVE METABOLIC PANEL - Abnormal; Notable for the following:    Chloride 99 (*)    Glucose, Bld 105 (*)    Creatinine, Ser 1.35 (*)    Total Bilirubin 1.4 (*)  All other components within normal limits  CBC WITH DIFFERENTIAL/PLATELET - Abnormal; Notable for the following:    WBC 14.1 (*)    Neutro Abs 10.5 (*)    Monocytes Absolute 1.7 (*)    All other components within normal limits  URINALYSIS, ROUTINE W REFLEX MICROSCOPIC (NOT AT [facility]) - Abnormal; Notable for the following:     Color, Urine AMBER (*)    Protein, ur 30 (*)    All other components within normal limits  URINE MICROSCOPIC-ADD ON - Abnormal; Notable for the following:    Squamous Epithelial / LPF 0-5 (*)    All other components within normal limits  CULTURE, BLOOD (ROUTINE X 2)  CULTURE, BLOOD (ROUTINE X 2)  URINE CULTURE  I-STAT CG4 LACTIC ACID, ED  I-STAT CG4 LACTIC ACID, ED    Imaging Review Mr Thoracic Spine W Wo Contrast  05/07/2015  CLINICAL DATA:  Two day history of fever and low back pain. Abnormal reflexes. EXAM: MRI THORACIC AND LUMBAR SPINE WITHOUT AND WITH CONTRAST TECHNIQUE: Multiplanar and multiecho pulse sequences of the thoracic and lumbar spine were obtained without and with intravenous contrast. CONTRAST:  16mL MULTIHANCE GADOBENATE DIMEGLUMINE 529 MG/ML IV SOLN COMPARISON:  None. FINDINGS: MR THORACIC SPINE FINDINGS Normal signal is present throughout the thoracic spinal cord. Marrow signal, vertebral body heights, alignment are normal. There is no focal disc protrusions or stenosis. The foramina are patent bilaterally. The paraspinous soft tissues are within normal limits. The postcontrast images demonstrate no pathologic enhancement. MR LUMBAR SPINE FINDINGS Normal signal is present in the conus medullaris which terminates at L1-2, within normal limits. Marrow signal, vertebral body heights, and alignment are normal. Disc signal and height are normal. There is no focal protrusion or stenosis. The foramina are patent. The postcontrast images demonstrate no pathologic enhancement. There is no evidence for infection. Limited imaging of the abdomen is unremarkable. There is no significant adenopathy. IMPRESSION: 1. Negative MRI of the thoracic and lumbar spine without and with contrast. 2. No focal enhancement to suggest infection or abscess. Electronically Signed   By: Marin Robertshristopher  Mattern M.D.   On: 05/07/2015 19:51   Mr Lumbar Spine W Wo Contrast  05/07/2015  CLINICAL DATA:  Two day  history of fever and low back pain. Abnormal reflexes. EXAM: MRI THORACIC AND LUMBAR SPINE WITHOUT AND WITH CONTRAST TECHNIQUE: Multiplanar and multiecho pulse sequences of the thoracic and lumbar spine were obtained without and with intravenous contrast. CONTRAST:  16mL MULTIHANCE GADOBENATE DIMEGLUMINE 529 MG/ML IV SOLN COMPARISON:  None. FINDINGS: MR THORACIC SPINE FINDINGS Normal signal is present throughout the thoracic spinal cord. Marrow signal, vertebral body heights, alignment are normal. There is no focal disc protrusions or stenosis. The foramina are patent bilaterally. The paraspinous soft tissues are within normal limits. The postcontrast images demonstrate no pathologic enhancement. MR LUMBAR SPINE FINDINGS Normal signal is present in the conus medullaris which terminates at L1-2, within normal limits. Marrow signal, vertebral body heights, and alignment are normal. Disc signal and height are normal. There is no focal protrusion or stenosis. The foramina are patent. The postcontrast images demonstrate no pathologic enhancement. There is no evidence for infection. Limited imaging of the abdomen is unremarkable. There is no significant adenopathy. IMPRESSION: 1. Negative MRI of the thoracic and lumbar spine without and with contrast. 2. No focal enhancement to suggest infection or abscess. Electronically Signed   By: Marin Robertshristopher  Mattern M.D.   On: 05/07/2015 19:51   I have personally reviewed and evaluated these  images and lab results as part of my medical decision-making.   EKG Interpretation None      MDM   Final diagnoses:  Flu-like symptoms    BP 112/76 mmHg  Pulse 112  Temp(Src) 100.2 F (37.9 C) (Oral)  Resp 16  Ht  (1.803 m)  Wt 77.111 kg  BMI 23.72 kg/m2  SpO2 98%   4:18 PM Patient presents with flulike symptoms. He is febrile and tachycardic. He does not appear toxic and no meningismal sign to suggest meningitis. He did had thoracic and lumbar MRI yesterday to  rule out epidural abscess. The MRI results were unremarkable. He has had a negative strep test and normal blood culture from yesterday's visit.  Given my low suspicion for meningitis as patient has no light or sound sensitivity and no nuchal rigidity, I will defer spinal tap at this time.  7:30 PM Patient felt better after receiving treatment. Fever improves as well as tachycardia improves after receiving IV fluid. Patient will be discharged with symptomatic treatment. Return precautions discussed.  Fayrene Helper, PA-C 05/08/15 1930  Bethann Berkshire, MD 05/08/15 [phone]

## 2015-05-08 NOTE — ED Notes (Signed)
Patient transported to X-ray 
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
## 2015-05-08 NOTE — ED Notes (Signed)
Pt given a urinal and aware urine sample is needed

## 2015-05-08 NOTE — ED Notes (Signed)
AVS explained in detail. Does not need work note. Knows to keep track of how much Tylenol he is taking if taking Tylenol #3. Knows not to drink/drive with medications prescribed. Pt is A&Ox4. Ambulatory with steady gait. Advised to maintain bland diet with increased water/electrolytes. No other c/c.

## 2015-05-08 NOTE — ED Notes (Signed)
Bed: WA07 Expected date:  Expected time:  Means of arrival:  Comments: Triage 5  

## 2015-05-08 NOTE — Discharge Instructions (Signed)
Viral Infections °A viral infection can be caused by different types of viruses. Most viral infections are not serious and resolve on their own. However, some infections may cause severe symptoms and may lead to further complications. °SYMPTOMS °Viruses can frequently cause: °· Minor sore throat. °· Aches and pains. °· Headaches. °· Runny nose. °· Different types of rashes. °· Watery eyes. °· Tiredness. °· Cough. °· Loss of appetite. °· Gastrointestinal infections, resulting in nausea, vomiting, and diarrhea. °These symptoms do not respond to antibiotics because the infection is not caused by bacteria. However, you might catch a bacterial infection following the viral infection. This is sometimes called a "superinfection." Symptoms of such a bacterial infection may include: °· Worsening sore throat with pus and difficulty swallowing. °· Swollen neck glands. °· Chills and a high or persistent fever. °· Severe headache. °· Tenderness over the sinuses. °· Persistent overall ill feeling (malaise), muscle aches, and tiredness (fatigue). °· Persistent cough. °· Yellow, green, or brown mucus production with coughing. °HOME CARE INSTRUCTIONS  °· Only take over-the-counter or prescription medicines for pain, discomfort, diarrhea, or fever as directed by your caregiver. °· Drink enough water and fluids to keep your urine clear or pale yellow. Sports drinks can provide valuable electrolytes, sugars, and hydration. °· Get plenty of rest and maintain proper nutrition. Soups and broths with crackers or rice are fine. °SEEK IMMEDIATE MEDICAL CARE IF:  °· You have severe headaches, shortness of breath, chest pain, neck pain, or an unusual rash. °· You have uncontrolled vomiting, diarrhea, or you are unable to keep down fluids. °· You or your child has an oral temperature above 102° F (38.9° C), not controlled by medicine. °· Your baby is older than 3 months with a rectal temperature of 102° F (38.9° C) or higher. °· Your baby is 3  months old or younger with a rectal temperature of 100.4° F (38° C) or higher. °MAKE SURE YOU:  °· Understand these instructions. °· Will watch your condition. °· Will get help right away if you are not doing well or get worse. °  °This information is not intended to replace advice given to you by your health care provider. Make sure you discuss any questions you have with your health care provider. °  °Document Released: 02/10/2005 Document Revised: 07/26/2011 Document Reviewed: 10/09/2014 °Elsevier Interactive Patient Education ©2016 Elsevier Inc. ° °

## 2015-05-09 LAB — URINE CULTURE

## 2015-05-09 LAB — CULTURE, GROUP A STREP

## 2015-05-09 NOTE — ED Notes (Signed)
Final report of strep screening positive for beta hemolytic strep. Discussed w Dr Randal BubaErin Honig, who authorize amoxicillin 500 mg, PO, BID x 7 days, NR. Called and advise patient, who asked [facility] Rx [facility]. Spoke directly w pharmacy staff

## 2015-05-10 LAB — URINE CULTURE: CULTURE: NO GROWTH

## 2015-05-12 LAB — CULTURE, BLOOD (ROUTINE X 2)
Culture: NO GROWTH
Culture: NO GROWTH

## 2015-05-13 LAB — CULTURE, BLOOD (ROUTINE X 2)
CULTURE: NO GROWTH
Culture: NO GROWTH

## 2015-05-18 ENCOUNTER — Emergency Department (HOSPITAL_COMMUNITY)
Admission: EM | Admit: 2015-05-18 | Discharge: 2015-05-18 | Disposition: A | Discharge status: Home / Self Care | Payer: No Typology Code available for payment source | Attending: Emergency Medicine | Admitting: Emergency Medicine

## 2015-05-18 ENCOUNTER — Emergency Department (HOSPITAL_COMMUNITY): Payer: No Typology Code available for payment source

## 2015-05-18 ENCOUNTER — Encounter (HOSPITAL_COMMUNITY): Payer: Self-pay | Admitting: Emergency Medicine

## 2015-05-18 DIAGNOSIS — Y998 Other external cause status: Secondary | ICD-10-CM | POA: Insufficient documentation

## 2015-05-18 DIAGNOSIS — M25511 Pain in right shoulder: Secondary | ICD-10-CM | POA: Diagnosis not present

## 2015-05-18 DIAGNOSIS — S4991XA Unspecified injury of right shoulder and upper arm, initial encounter: Secondary | ICD-10-CM | POA: Diagnosis not present

## 2015-05-18 DIAGNOSIS — Y92410 Unspecified street and highway as the place of occurrence of the external cause: Secondary | ICD-10-CM | POA: Insufficient documentation

## 2015-05-18 DIAGNOSIS — Z79899 Other long term (current) drug therapy: Secondary | ICD-10-CM | POA: Diagnosis not present

## 2015-05-18 DIAGNOSIS — S99922A Unspecified injury of left foot, initial encounter: Secondary | ICD-10-CM | POA: Diagnosis not present

## 2015-05-18 DIAGNOSIS — M79672 Pain in left foot: Secondary | ICD-10-CM | POA: Diagnosis not present

## 2015-05-18 DIAGNOSIS — Y9389 Activity, other specified: Secondary | ICD-10-CM | POA: Diagnosis not present

## 2015-05-18 DIAGNOSIS — Z791 Long term (current) use of non-steroidal anti-inflammatories (NSAID): Secondary | ICD-10-CM | POA: Diagnosis not present

## 2015-05-18 DIAGNOSIS — S99912A Unspecified injury of left ankle, initial encounter: Secondary | ICD-10-CM | POA: Insufficient documentation

## 2015-05-18 DIAGNOSIS — M79671 Pain in right foot: Secondary | ICD-10-CM | POA: Diagnosis not present

## 2015-05-18 DIAGNOSIS — S8991XA Unspecified injury of right lower leg, initial encounter: Secondary | ICD-10-CM | POA: Insufficient documentation

## 2015-05-18 DIAGNOSIS — M25561 Pain in right knee: Secondary | ICD-10-CM | POA: Diagnosis not present
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
## 2015-07-03 MED FILL — IBUPROFEN 600 MG TABLET: 600 | 3 days supply | Qty: 14 | Fill #0

## 2015-11-08 ENCOUNTER — Encounter (HOSPITAL_COMMUNITY): Payer: Self-pay

## 2015-11-08 ENCOUNTER — Ambulatory Visit (HOSPITAL_COMMUNITY)
Admission: EM | Admit: 2015-11-08 | Discharge: 2015-11-08 | Disposition: A | Discharge status: Home / Self Care | Payer: 59 | Attending: Family Medicine | Admitting: Family Medicine

## 2015-11-08 DIAGNOSIS — S91209A Unspecified open wound of unspecified toe(s) with damage to nail, initial encounter: Secondary | ICD-10-CM

## 2015-11-08 DIAGNOSIS — S99922A Unspecified injury of left foot, initial encounter: Secondary | ICD-10-CM

## 2015-11-08 MED ORDER — IBUPROFEN 600 MG PO TABS: 600.0000 mg | ORAL_TABLET | Freq: Three times a day (TID) | ORAL | Status: DC | PRN

## 2015-11-08 MED ORDER — LIDOCAINE HCL 2 % IJ SOLN
INTRAMUSCULAR | Status: AC
  Filled 2015-11-08: qty 20

## 2015-11-08 NOTE — ED Provider Notes (Addendum)
CSN: [id number]     Arrival date & time 11/08/15  1601 History   First MD Initiated Contact with Patient 11/08/15 1612     No chief complaint on file.  (Consider location/radiation/quality/duration/timing/severity/associated sxs/prior Treatment) Patient is a 30 y.o. male presenting with foot injury. The history is provided by the patient. No language interpreter was used.  Foot Injury Location:  Foot Time since incident:  14 hours Injury: yes   Mechanism of injury comment:  Hit foot against the edge of the bed last night Foot location:  L foot (2nd toe nail  avulsed.) Pain details:    Quality:  Aching and pressure (pain is about 5/10 in severity, worse with touch)   Radiates to:  Does not radiate   Severity:  Moderate   Timing:  Constant   Progression:  Unchanged Chronicity:  New Tetanus status:  Up to date (per patient) Relieved by:  Movement and rest Exacerbated by: touch. Ineffective treatments: hydrogen peroxide. Associated symptoms: itching   Associated symptoms: no decreased ROM, no numbness, no stiffness, no swelling and no tingling     History reviewed. No pertinent past medical history. History reviewed. No pertinent past surgical history. No family history on file. Social History  Substance Use Topics  . Smoking status: Never Smoker   . Smokeless tobacco: Never Used  . Alcohol Use: Yes     Comment: occ    Review of Systems  Respiratory: Negative.   Cardiovascular: Negative.   Musculoskeletal: Negative for stiffness.       Trauma to left 2nd toe nail  Skin: Positive for itching.  All other systems reviewed and are negative.   Allergies  Review of patient's allergies indicates no known allergies.  Home Medications   Prior to Admission medications   Medication Sig Start Date End Date Taking? Authorizing Provider  acetaminophen-codeine (TYLENOL #3) 300-30 MG tablet Take 1-2 tablets by mouth every 6 (six) hours as needed for moderate pain. 05/08/15   Fayrene HelperBowie  Tran, PA-C  ibuprofen (ADVIL,MOTRIN) 800 MG tablet Take 1 tablet (800 mg total) by mouth 3 (three) times daily. 05/07/15   Elpidio AnisShari Upstill, PA-C  methocarbamol (ROBAXIN) 500 MG tablet Take 1 tablet (500 mg total) by mouth 2 (two) times daily. 05/07/15   Elpidio AnisShari Upstill, PA-C  omeprazole (PRILOSEC) 20 MG capsule Take 20 mg by mouth daily as needed (stomach pain, ulcer).     Historical Provider, MD  promethazine-dextromethorphan (PROMETHAZINE-DM) 6.25-15 MG/5ML syrup Take 5 mLs by mouth 4 (four) times daily as needed for cough. 05/08/15   Fayrene HelperBowie Tran, PA-C   Meds Ordered and Administered this Visit  Medications - No data to display  BP 134/86 mmHg  Pulse 75  Temp(Src) 98.2 F (36.8 C) (Oral)  Resp 15  SpO2 98% No data found.   Physical Exam  Constitutional: He appears well-developed. No distress.  Cardiovascular: Normal rate, regular rhythm, normal heart sounds and intact distal pulses.   No murmur heard. Pulses:      Dorsalis pedis pulses are 2+ on the left side.  Pulmonary/Chest: Effort normal and breath sounds normal. No respiratory distress. He has no wheezes.  Musculoskeletal:       Feet:  Nursing note and vitals reviewed.   ED Course  .Nail Removal Date/Time: 11/08/2015 4:28 PM Performed by: Janit PaganENIOLA, Zakyia Gagan T Authorized by: Janit PaganENIOLA, Royston Bekele T Consent: Verbal consent obtained. Written consent not obtained. Risks and benefits: risks, benefits and alternatives were discussed Consent given by: patient Patient understanding: patient states understanding of  the procedure being performed Patient consent: the patient's understanding of the procedure matches consent given Procedure consent: procedure consent matches procedure scheduled Site marked: the operative site was marked Patient identity confirmed: verbally with patient Time out: Immediately prior to procedure a "time out" was called to verify the correct patient, procedure, equipment, support staff and site/side marked as  required. Location: left foot Location details: left second toe Anesthesia: local infiltration Local anesthetic: lidocaine 2% without epinephrine Patient sedated: no Preparation: skin prepped with alcohol and skin prepped with Betadine Amount removed: complete Nail bed sutured: no Nail matrix removed: none Removed nail replaced and anchored: no Dressing: antibiotic ointment and dressing applied   (including critical care time)  Labs Review Labs Reviewed - No data to display  Imaging Review No results found.   Visual Acuity Review  Right Eye Distance:   Left Eye Distance:   Bilateral Distance:    Right Eye Near:   Left Eye Near:    Bilateral Near:         MDM  No diagnosis found. Nail avulsion of toe, initial encounter  Foot trauma, left, initial encounter  Procedure was well tolerated with very minimal blood loss. Dangling toe nail removed without difficulty. Phenol not used and patient wants his nail to grow back. Wound dressing applied.Continue home wound care. Return precaution discussion. F/U as needed    Doreene ElandKehinde T Schelly Chuba, MD 11/08/15 1659

## 2015-11-08 NOTE — ED Notes (Signed)
Patient hit foot on edge of bed last night and toe nail is hanging and would like it removed No acute distress

## 2015-11-08 NOTE — Discharge Instructions (Signed)
Fingernail or Toenail Removal, Care After Refer to this sheet in the next few weeks. These instructions provide you with information about caring for yourself after your procedure. Your health care provider may also give you more specific instructions. Your treatment has been planned according to current medical practices, but problems sometimes occur. Call your health care provider if you have any problems or questions after your procedure. WHAT TO EXPECT AFTER THE PROCEDURE After your procedure, it is common to have:  Redness.  Swelling. HOME CARE INSTRUCTIONS  If you have a splint on your finger:  Wear it as directed by your health care provider. Remove it only as directed by your health care provider.  Loosen the splint if your fingers become numb and tingle, or if they turn cold and blue.  If you were given a surgical shoe, wear it as directed by your health care provider.  Take medicines only as directed by your health care provider.  Elevate your hand or foot as much of the time as possible. This helps with pain and swelling.  If you are recovering from fingernail removal, keep your hand raised above the level of your heart.  If you are recovering from toenail removal, lie on a bed or a couch with your leg propped up on pillows, or sit in a reclining chair with the footrest up.  Follow instructions from your health care provider about bandage (dressing) changes and removal:  Change your dressing 24 hours after your procedure or as directed by your health care provider.  Soak your hand or foot in warm, soapy water for 10-20 minutes or as directed by your health care provider. Do this 3 times per day or as directed by your health care provider. This reduces pain and swelling.  After you soak your hand or foot, apply a clean, dry dressing.  Keep your dressing clean and dry. Change your dressing whenever it gets wet or dirty.  Keep all follow-up visits as directed by your  health care provider. This is important. SEEK MEDICAL CARE IF:  You have increased redness or pain at your nail area.  You have increased fluid, blood, or pus coming from your nail area.  There is a bad smell coming from the dressing.  You have a fever.  Your swelling gets worse, or you have swelling that spreads from your finger to your hand or from your toe to your foot.  You have worsening redness that spreads from your finger to your hand or from your toe up to your foot.  Your finger or toe looks blue or black.   This information is not intended to replace advice given to you by your health care provider. Make sure you discuss any questions you have with your health care provider.   Document Released: 05/24/2014 Document Reviewed: 05/24/2014 Elsevier Interactive Patient Education 2016 [employer]  

## 2016-08-27 ENCOUNTER — Ambulatory Visit (INDEPENDENT_AMBULATORY_CARE_PROVIDER_SITE_OTHER): Payer: 59 | Admitting: Podiatry

## 2016-08-27 ENCOUNTER — Encounter: Payer: Self-pay | Admitting: Podiatry

## 2016-08-27 ENCOUNTER — Ambulatory Visit (INDEPENDENT_AMBULATORY_CARE_PROVIDER_SITE_OTHER): Payer: 59

## 2016-08-27 VITALS — BP 127/81 | HR 81 | Resp 16

## 2016-08-27 DIAGNOSIS — M7752 Other enthesopathy of left foot: Secondary | ICD-10-CM | POA: Diagnosis not present

## 2016-08-27 DIAGNOSIS — L603 Nail dystrophy: Secondary | ICD-10-CM

## 2016-08-27 DIAGNOSIS — B351 Tinea unguium: Secondary | ICD-10-CM | POA: Diagnosis not present

## 2016-08-27 DIAGNOSIS — M205X2 Other deformities of toe(s) (acquired), left foot: Secondary | ICD-10-CM | POA: Diagnosis not present

## 2016-08-27 DIAGNOSIS — L74519 Primary focal hyperhidrosis, unspecified: Secondary | ICD-10-CM

## 2016-08-27 DIAGNOSIS — M779 Enthesopathy, unspecified: Principal | ICD-10-CM

## 2016-08-27 DIAGNOSIS — A499 Bacterial infection, unspecified: Secondary | ICD-10-CM | POA: Diagnosis not present

## 2016-08-27 DIAGNOSIS — M722 Plantar fascial fibromatosis: Secondary | ICD-10-CM

## 2016-08-27 DIAGNOSIS — R61 Generalized hyperhidrosis: Secondary | ICD-10-CM

## 2016-08-27 DIAGNOSIS — M778 Other enthesopathies, not elsewhere classified: Secondary | ICD-10-CM

## 2016-08-27 DIAGNOSIS — L608 Other nail disorders: Secondary | ICD-10-CM | POA: Diagnosis not present

## 2016-08-27 NOTE — Progress Notes (Signed)
Subjective:    Patient ID: Bruce Gonzalez, male    DOB: [birth year], 31 y.o.   MRN: [id number]  HPI 30 year old male presents the office today for concerns of swelling to his feet, flatfoot and pain to his big toe joint and is interested in orthotics is most concerned about his right second digit toenail today. He states the nails thick and discolored and others pain with pressure in shoes. He denies any redness or drainage or any swelling.Marland Kitchen He previously had the left second digit toenail removed and is interested in doing this and the right side. He has no other complaints today. He said no recent treatment for either of these issues.   Review of Systems  All other systems reviewed and are negative.      Objective:   Physical Exam General: AAO x3, NAD  Dermatological: Right second digit toenail is hypertrophic, dystrophic, discolored. There is tenderness entire toenail is no surrounding redness or drainage. No open sores are identified. 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 No evidence of tinea pedis no open sores present.  Vascular: Dorsalis Pedis artery and Posterior Tibial artery pedal pulses are 2/4 bilateral with immedate capillary fill time.  There is no pain with calf compression, swelling, warmth, erythema.   Neruologic: Grossly intact via light touch bilateral. Vibratory intact via tuning fork bilateral. Protective threshold with Semmes Wienstein monofilament intact to all pedal sites bilateral.   Musculoskeletal: There is a decrease in first MTPJ range of motion on the left side. This is a chronic issue is been ongoing for several years. This decrease in medial arch height. Subjective: There is tenderness on the medial band plantar fasciitis of the foot however there is no pain today. No area pinpoint maintenance otherwise. Muscular strength 5/5 in all groups tested bilateral.  Gait: Unassisted, Nonantalgic.      Assessment & Plan:  31 year old male with  hyperhidrosis, onychodystrophy right second digit toenail, hallux limitus/plantar fasciitis/flatfoot -Treatment options discussed including all alternatives, risks, and complications -Etiology of symptoms were discussed  1. Right second digit onychodystrophy At this time, recommended partial nail removal without chemical matricectomy to the right 2nd digit toenail. Risks and complications were discussed with the patient for which they understand and  verbally consent to the procedure. Under sterile conditions a total of 3 mL of a mixture of 2% lidocaine plain and 0.5% Marcaine plain was infiltrated in a digital block fashion. Once anesthetized, the skin was prepped in sterile fashion. A tourniquet was then applied. Next the nail was excised making sure to remove the entire offending nail border. Once the nail was  Removed, the area was debrided and the underlying skin was intact. The area was irrigated and hemostasis was obtained.  A dry sterile dressing was applied. After application of the dressing the tourniquet was removed and there is found to be an immediate capillary refill time to the digit. The patient tolerated the procedure well any complications. Post procedure instructions were discussed the patient for which he verbally understood. Follow-up in one week for nail check or sooner if any problems are to arise. Discussed signs/symptoms of worsening infection and directed to call the office immediately should any occur or go directly to the emergency room. In the meantime, encouraged to call the office with any questions, concerns, changes symptoms.  2. Left foot pain -Discussed orthotics. He'll at proceed with these but we will measure him next appointment.  3. Hyperhidrosis -Discussed dysol. Will prescribe next appointment after healing of  the toenail.  -Change in shoes/socks discussed.  -Vinegar soaks.

## 2016-08-27 NOTE — Patient Instructions (Signed)

## 2016-08-27 NOTE — Progress Notes (Signed)
Patient ID: Bruce Gonzalez, male   DOB: 11/29/1985, 32 y.o.   MRN: [id number]

## 2016-09-06 ENCOUNTER — Ambulatory Visit: Payer: 59 | Admitting: Podiatry

## 2016-09-17 ENCOUNTER — Telehealth: Payer: Self-pay | Admitting: *Deleted

## 2016-09-17 NOTE — Telephone Encounter (Addendum)
-----   Message from Vivi BarrackMatthew R Wagoner, DPM sent at 09/13/2016  7:56 AM EDT ----- Negative fungus. Can try urea. Please let him know. He was a no show for his last appointment. 09/17/2016-I informed pt of Dr. Gabriel RungWagoner's review of fungal culture and recommendation.

## 2017-02-19 IMAGING — CR DG CHEST 2V
2 series · 2 of 2 positions shown · non-contrast
Comparison: None.

CLINICAL DATA: Fever, body aches, fatigue, nausea and cough.

EXAM:
CHEST  2 VIEW

[w chest pa]
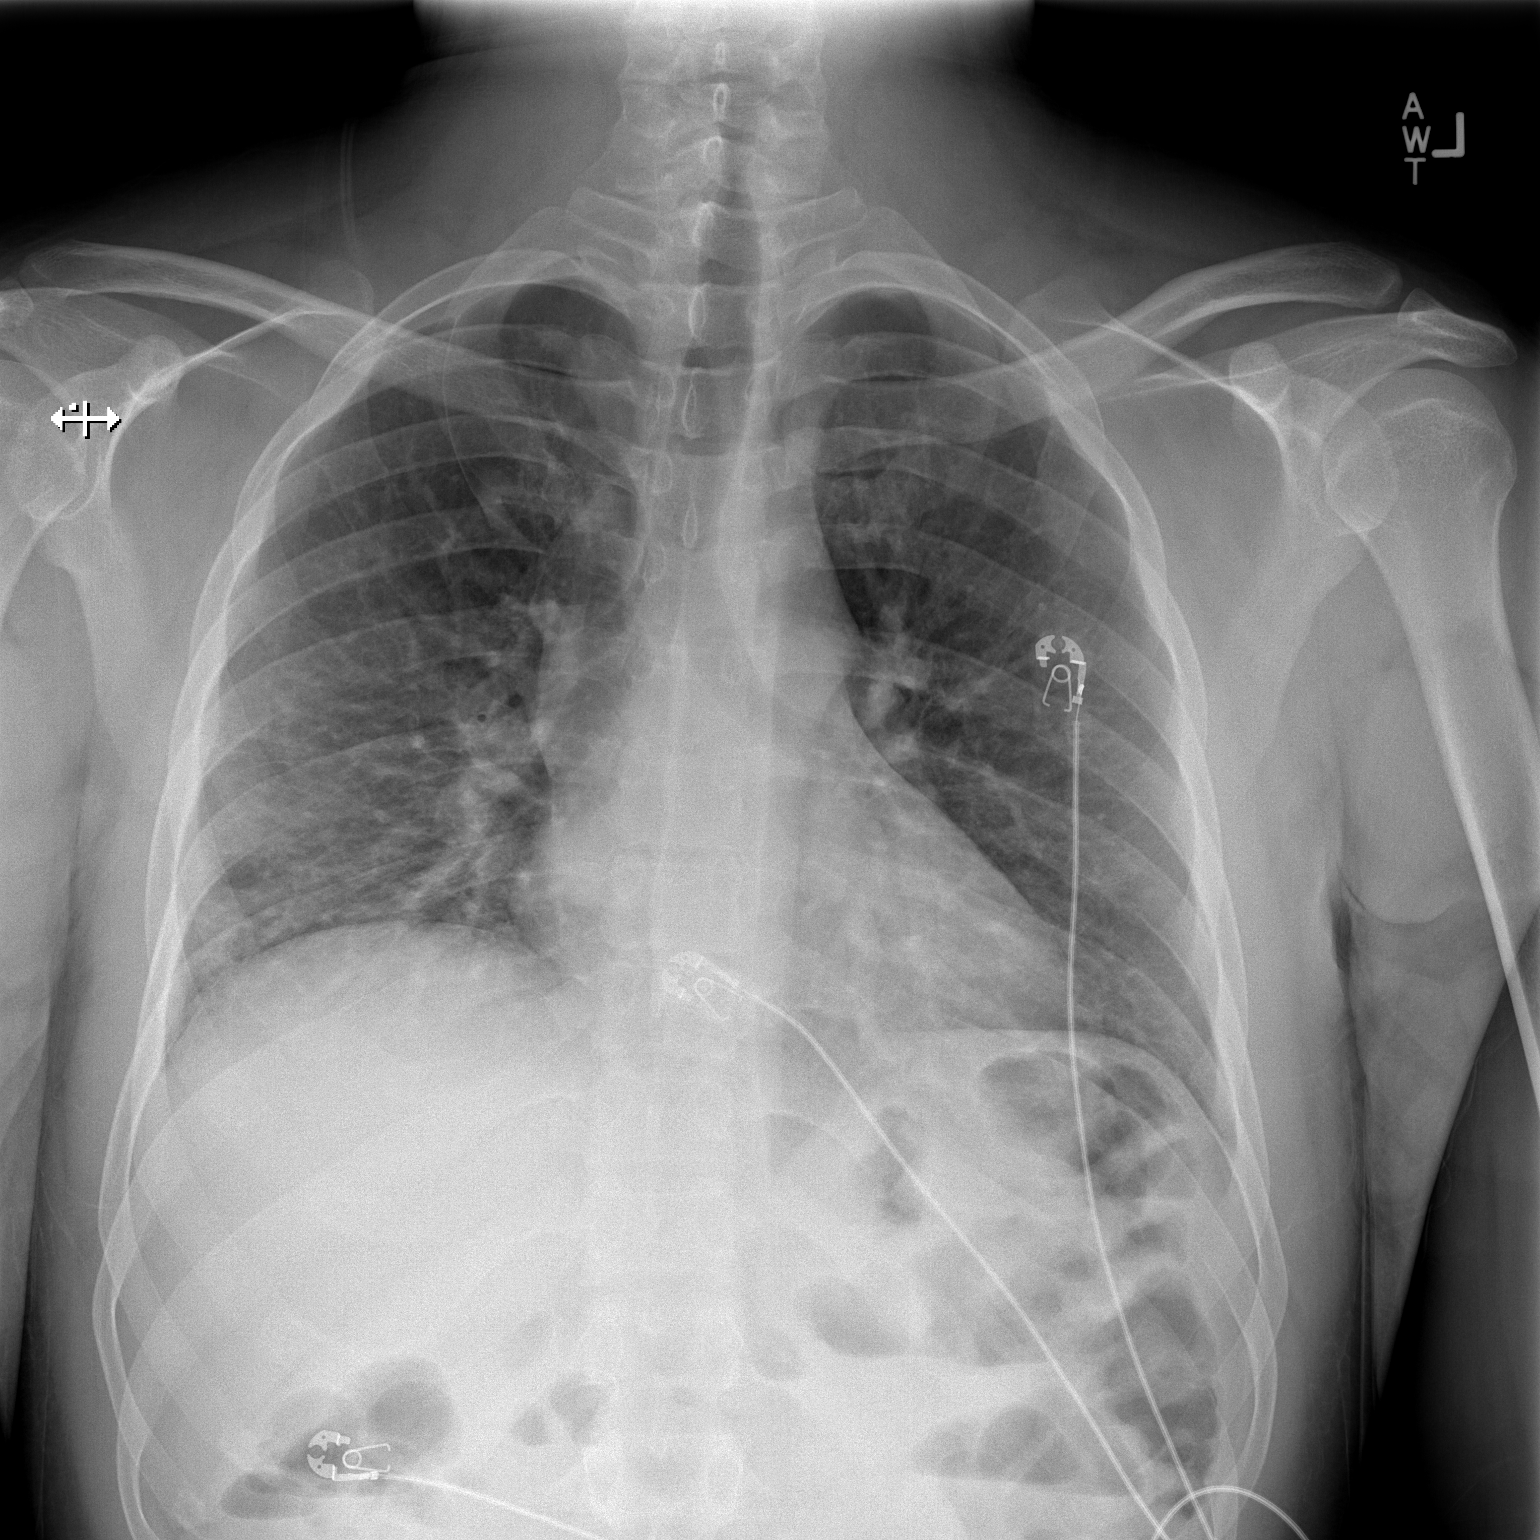

[w chest lat]
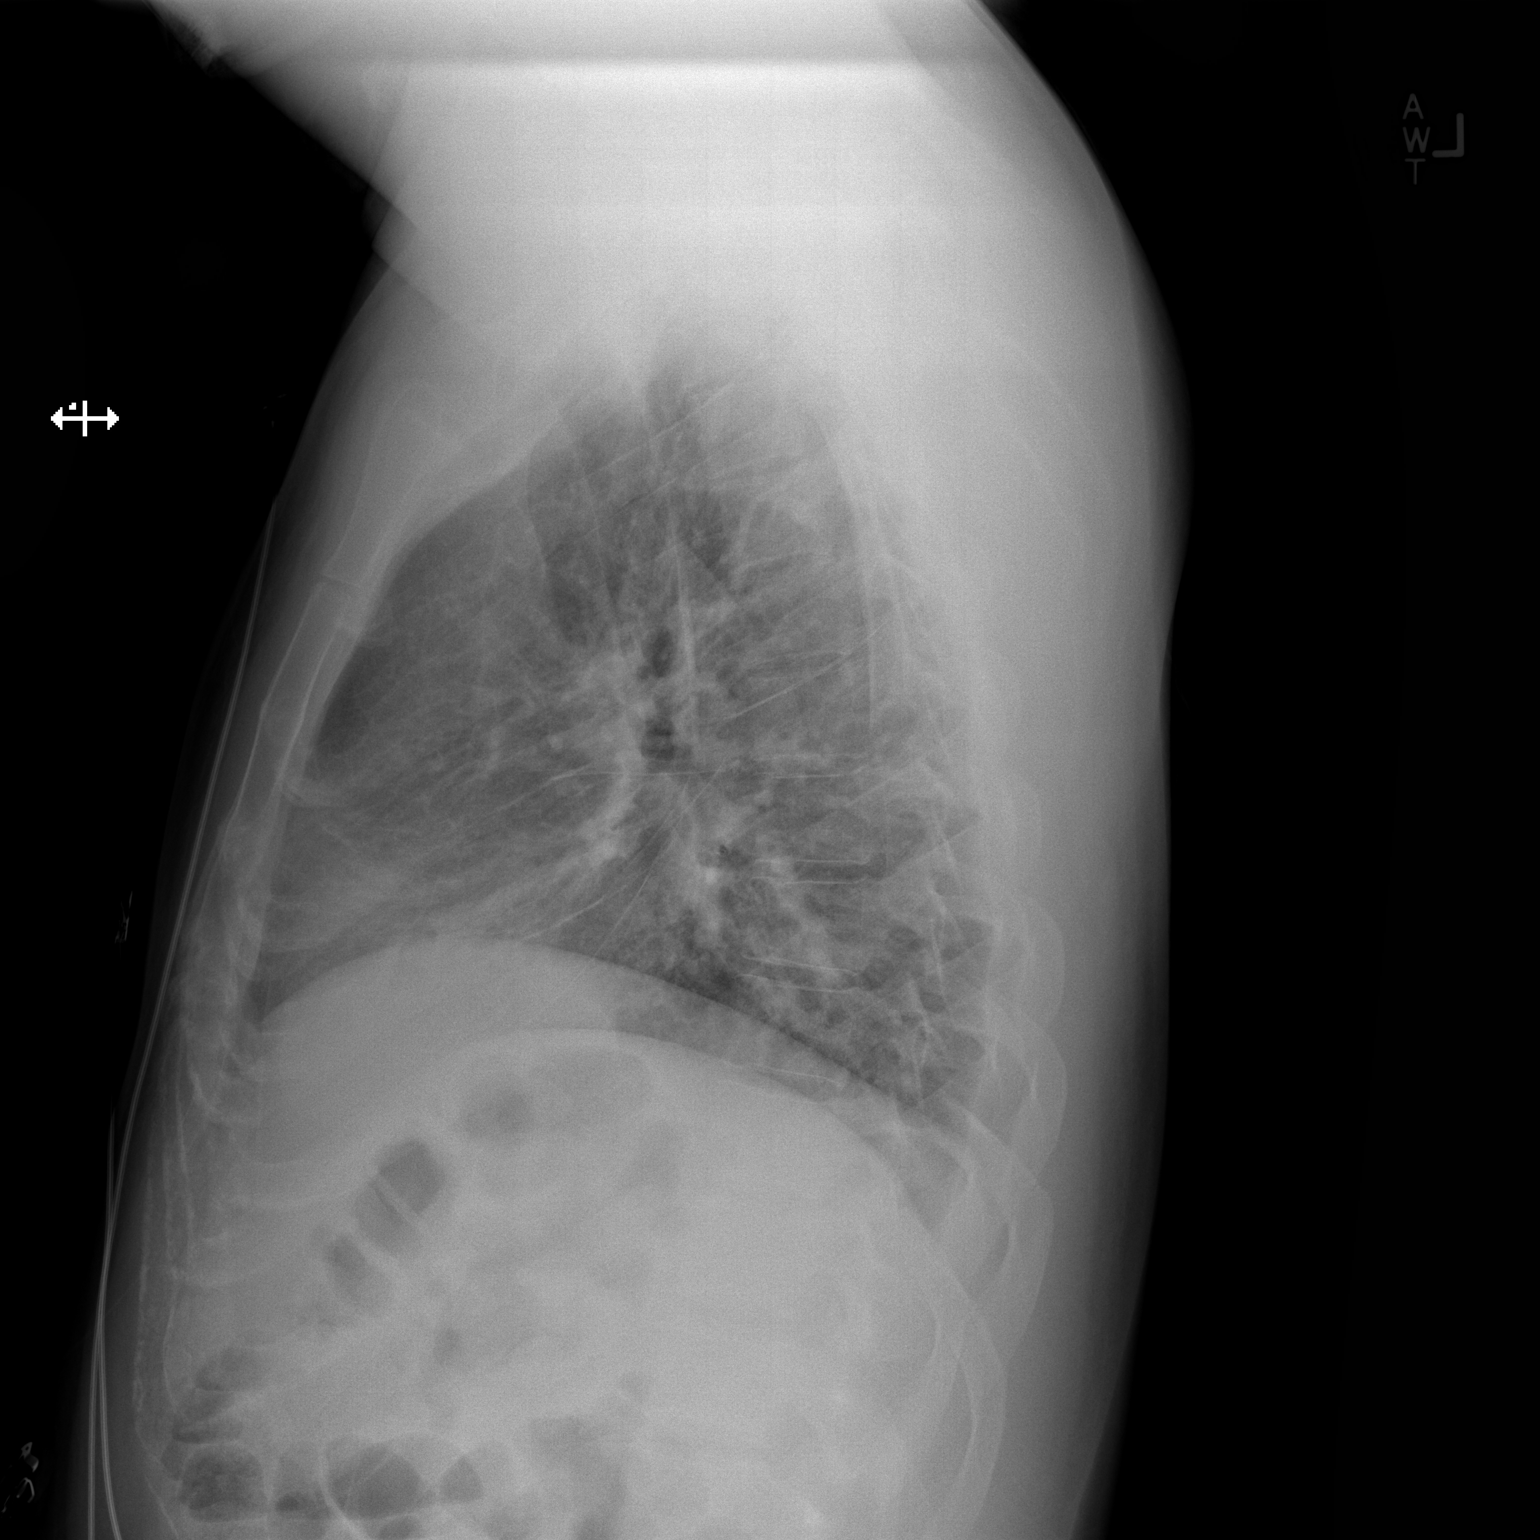

[2 of 2 positions shown; findings below may reference images not displayed]

FINDINGS: There is peribronchial interstitial thickening in the mid right
lower lung involving the right lower lobe and right middle lobe. No
focal lung consolidation. There is no evidence of pulmonary edema.
No pleural effusion or pneumothorax.

Heart, mediastinum hila are unremarkable.

Skeletal structures are unremarkable.
IMPRESSION: Mild interstitial thickening in the right lower lung. This may be
due to bronchitis/ bronchiolitis. No evidence of lobar pneumonia.

## 2017-08-31 ENCOUNTER — Encounter (HOSPITAL_COMMUNITY): Payer: Self-pay | Admitting: Family Medicine

## 2017-08-31 ENCOUNTER — Ambulatory Visit (HOSPITAL_COMMUNITY)
Admission: EM | Admit: 2017-08-31 | Discharge: 2017-08-31 | Disposition: A | Discharge status: Home / Self Care | Payer: 59 | Attending: Family Medicine | Admitting: Family Medicine

## 2017-08-31 DIAGNOSIS — R0981 Nasal congestion: Secondary | ICD-10-CM | POA: Insufficient documentation

## 2017-08-31 DIAGNOSIS — R509 Fever, unspecified: Secondary | ICD-10-CM

## 2017-08-31 DIAGNOSIS — B349 Viral infection, unspecified: Secondary | ICD-10-CM

## 2017-08-31 DIAGNOSIS — R05 Cough: Secondary | ICD-10-CM | POA: Insufficient documentation

## 2017-08-31 DIAGNOSIS — J029 Acute pharyngitis, unspecified: Secondary | ICD-10-CM | POA: Insufficient documentation

## 2017-08-31 LAB — POCT RAPID STREP A: STREPTOCOCCUS, GROUP A SCREEN (DIRECT): NEGATIVE

## 2017-08-31 MED ORDER — FLUTICASONE PROPIONATE 50 MCG/ACT NA SUSP: 2.0000 | Freq: Every day | NASAL | 0 refills | Status: DC

## 2017-08-31 MED ORDER — PHENOL 1.4 % MT LIQD: 1.0000 | OROMUCOSAL | 0 refills | Status: DC | PRN

## 2017-08-31 MED ORDER — BENZONATATE 100 MG PO CAPS: 100.0000 mg | ORAL_CAPSULE | Freq: Three times a day (TID) | ORAL | 0 refills | Status: DC

## 2017-08-31 MED ORDER — IPRATROPIUM BROMIDE 0.06 % NA SOLN: 2.0000 | Freq: Four times a day (QID) | NASAL | 0 refills | Status: DC

## 2017-08-31 NOTE — Discharge Instructions (Signed)
Rapid strep negative. Tessalon for cough. Phenol for sore throat. Start flonase, atrovent nasal spray for nasal congestion/drainage. You can use over the counter nasal saline rinse such as neti pot for nasal congestion. Keep hydrated, your urine should be clear to pale yellow in color. Tylenol/motrin for fever and pain. Monitor for any worsening of symptoms, chest pain, shortness of breath, wheezing, swelling of the throat, follow up for reevaluation.  ° °For sore throat try using a honey-based tea. Use 3 teaspoons of honey with juice squeezed from half lemon. Place shaved pieces of ginger into 1/2-1 cup of water and warm over stove top. Then mix the ingredients and repeat every 4 hours as needed. ° °

## 2017-08-31 NOTE — ED Provider Notes (Signed)
[facility]    CSN: [id number] Arrival date & time: 08/31/17  1013     History   Chief Complaint Chief Complaint  Patient presents with  . Cough  . Nasal Congestion    HPI Bruce Gonzalez is a 32 y.o. male.   32 year old male comes in for 5-day history of URI symptoms.  Has had cough, congestion, rhinorrhea, postnasal drip, sore throat.  Subjective fever.  Painful swallowing, otherwise eating and drinking without problems.  Denies swelling of the throat, trouble swallowing, trouble breathing. Has been taking TheraFlu and NyQuil without relief.  Denies seasonal allergies.  Denies sick contact.  Never smoker.     History reviewed. No pertinent past medical history.  Patient Active Problem List   Diagnosis Date Noted  . ANEMIA, IRON DEFICIENCY 11/26/2009  . ACUT DUOD ULCER W/HEMORR W/O MENTION OBSTRUCTION 11/26/2009    History reviewed. No pertinent surgical history.     Home Medications    Prior to Admission medications   Medication Sig Start Date End Date Taking? Authorizing Provider  benzonatate (TESSALON) 100 MG capsule Take 1 capsule (100 mg total) by mouth every 8 (eight) hours. 08/31/17   Cathie HoopsYu, Jacy Howat V, PA-C  fluticasone (FLONASE) 50 MCG/ACT nasal spray Place 2 sprays into both nostrils daily. 08/31/17   Cathie HoopsYu, Devoiry Corriher V, PA-C  ipratropium (ATROVENT) 0.06 % nasal spray Place 2 sprays into both nostrils 4 (four) times daily. 08/31/17   Cathie HoopsYu, Roxy Filler V, PA-C  phenol (CHLORASEPTIC) 1.4 % LIQD Use as directed 1 spray in the mouth or throat as needed for throat irritation / pain. 08/31/17   Belinda FisherYu, Pamila Mendibles V, PA-C    Family History No family history on file.  Social History Social History   Tobacco Use  . Smoking status: Never Smoker  . Smokeless tobacco: Never Used  Substance Use Topics  . Alcohol use: Yes    Comment: occ  . Drug use: No     Allergies   Patient has no known allergies.   Review of Systems Review of Systems  Reason unable to perform ROS: See HPI as  above.     Physical Exam Triage Vital Signs ED Triage Vitals  Enc Vitals Group     BP 08/31/17 1037 130/89     Pulse Rate 08/31/17 1037 (!) 120     Resp 08/31/17 1037 20     Temp 08/31/17 1037 98.8 F (37.1 C)     Temp src --      SpO2 08/31/17 1037 95 %     Weight --      Height --      Head Circumference --      Peak Flow --      Pain Score 08/31/17 1036 6     Pain Loc --      Pain Edu? --      Excl. in GC? --    No data found.  Updated Vital Signs BP 130/89   Pulse (!) 120   Temp 98.8 F (37.1 C)   Resp 20   SpO2 95%   Physical Exam  Constitutional: He is oriented to person, place, and time. He appears well-developed and well-nourished. No distress.  HENT:  Head: Normocephalic and atraumatic.  Right Ear: Tympanic membrane, external ear and ear canal normal. Tympanic membrane is not erythematous and not bulging.  Left Ear: Tympanic membrane, external ear and ear canal normal. Tympanic membrane is not erythematous and not bulging.  Nose: Mucosal edema and rhinorrhea  present. Right sinus exhibits no maxillary sinus tenderness and no frontal sinus tenderness. Left sinus exhibits no maxillary sinus tenderness and no frontal sinus tenderness.  Mouth/Throat: Uvula is midline and mucous membranes are normal. Posterior oropharyngeal erythema present. No tonsillar exudate.  Eyes: Pupils are equal, round, and reactive to light. Conjunctivae are normal.  Neck: Normal range of motion. Neck supple.  Cardiovascular: Regular rhythm and normal heart sounds. Tachycardia present. Exam reveals no gallop and no friction rub.  No murmur heard. Pulmonary/Chest: Effort normal and breath sounds normal. He has no decreased breath sounds. He has no wheezes. He has no rhonchi. He has no rales.  Lymphadenopathy:    He has no cervical adenopathy.  Neurological: He is alert and oriented to person, place, and time.  Skin: Skin is warm and dry.  Psychiatric: He has a normal mood and affect. His  behavior is normal. Judgment normal.     UC Treatments / Results  Labs (all labs ordered are listed, but only abnormal results are displayed) Labs Reviewed  CULTURE, GROUP A STREP [facility])  POCT RAPID STREP A    EKG None Radiology No results found.  Procedures Procedures (including critical care time)  Medications Ordered in UC Medications - No data to display   Initial Impression / Assessment and Plan / UC Course  I have reviewed the triage vital signs and the nursing notes.  Pertinent labs & imaging results that were available during my care of the patient were reviewed by me and considered in my medical decision making (see chart for details).    Rapid strep negative. Patient is nontoxic in appearance. Symptomatic treatment as needed. Return precautions given.   Final Clinical Impressions(s) / UC Diagnoses   Final diagnoses:  Viral illness    ED Discharge Orders        Ordered    benzonatate (TESSALON) 100 MG capsule  Every 8 hours     08/31/17 1116    fluticasone (FLONASE) 50 MCG/ACT nasal spray  Daily     08/31/17 1116    ipratropium (ATROVENT) 0.06 % nasal spray  4 times daily     08/31/17 1116    phenol (CHLORASEPTIC) 1.4 % LIQD  As needed     08/31/17 1116        Belinda Fisher, PA-C 08/31/17 1142

## 2017-08-31 NOTE — ED Triage Notes (Signed)
Pt for cough, congestion, drainage and sore throat since Friday. He has been taking thera flu and NyQuil.

## 2017-09-02 LAB — CULTURE, GROUP A STREP (THRC)

## 2020-04-15 ENCOUNTER — Encounter (HOSPITAL_COMMUNITY): Payer: Self-pay

## 2020-04-15 ENCOUNTER — Ambulatory Visit (HOSPITAL_COMMUNITY)
Admission: EM | Admit: 2020-04-15 | Discharge: 2020-04-15 | Disposition: A | Discharge status: Home / Self Care | Payer: Self-pay | Attending: Family Medicine | Admitting: Family Medicine

## 2020-04-15 ENCOUNTER — Other Ambulatory Visit (HOSPITAL_COMMUNITY): Payer: Self-pay | Admitting: Family Medicine

## 2020-04-15 ENCOUNTER — Other Ambulatory Visit: Payer: Self-pay

## 2020-04-15 DIAGNOSIS — R519 Headache, unspecified: Secondary | ICD-10-CM

## 2020-04-15 MED ORDER — TIZANIDINE HCL 4 MG PO TABS: 4.0000 mg | ORAL_TABLET | Freq: Four times a day (QID) | ORAL | 0 refills | Status: DC | PRN

## 2020-04-15 MED FILL — tiZANidine HCL 4 MG TABS: 4 | 8 days supply | Qty: 30 | Fill #0
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
## 2020-04-15 NOTE — Discharge Instructions (Addendum)
Zanaflex as needed for muscle relaxation Rest, alternate heat and ice.  Gentle stretching. Follow up as needed for continued or worsening symptoms
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
## 2020-07-21 ENCOUNTER — Encounter (HOSPITAL_COMMUNITY): Payer: Self-pay

## 2020-07-21 ENCOUNTER — Ambulatory Visit (HOSPITAL_COMMUNITY)
Admission: EM | Admit: 2020-07-21 | Discharge: 2020-07-21 | Disposition: A | Discharge status: Home / Self Care | Payer: Self-pay | Attending: Family Medicine | Admitting: Family Medicine

## 2020-07-21 ENCOUNTER — Other Ambulatory Visit (HOSPITAL_COMMUNITY): Payer: Self-pay | Admitting: Family Medicine

## 2020-07-21 ENCOUNTER — Other Ambulatory Visit: Payer: Self-pay

## 2020-07-21 DIAGNOSIS — K5900 Constipation, unspecified: Secondary | ICD-10-CM | POA: Insufficient documentation

## 2020-07-21 DIAGNOSIS — R1032 Left lower quadrant pain: Secondary | ICD-10-CM | POA: Insufficient documentation

## 2020-07-21 LAB — COMPREHENSIVE METABOLIC PANEL
ALT: 58 U/L — ABNORMAL HIGH (ref 0–44)
AST: 30 U/L (ref 15–41)
Albumin: 3.9 g/dL (ref 3.5–5.0)
Alkaline Phosphatase: 69 U/L (ref 38–126)
Anion gap: 10 (ref 5–15)
BUN: 12 mg/dL (ref 6–20)
CO2: 27 mmol/L (ref 22–32)
Calcium: 9.6 mg/dL (ref 8.9–10.3)
Chloride: 103 mmol/L (ref 98–111)
Creatinine, Ser: 1.1 mg/dL (ref 0.61–1.24)
GFR, Estimated: >60 mL/min (ref >60)
Glucose, Bld: 84 mg/dL (ref 70–99)
Potassium: 4.6 mmol/L (ref 3.5–5.1)
Sodium: 140 mmol/L (ref 135–145)
Total Bilirubin: 1.3 mg/dL — ABNORMAL HIGH (ref 0.3–1.2)
Total Protein: 7.7 g/dL (ref 6.5–8.1)

## 2020-07-21 LAB — CBC WITH DIFFERENTIAL/PLATELET
Abs Immature Granulocytes: 0.02 10*3/uL (ref 0.00–0.07)
Basophils Absolute: 0.1 10*3/uL (ref 0.0–0.1)
Basophils Relative: 1 %
Eosinophils Absolute: 0.3 10*3/uL (ref 0.0–0.5)
Eosinophils Relative: 3 %
HCT: 47.6 % (ref 39.0–52.0)
Hemoglobin: 16 g/dL (ref 13.0–17.0)
Immature Granulocytes: 0 %
Lymphocytes Relative: 25 %
Lymphs Abs: 2.2 10*3/uL (ref 0.7–4.0)
MCH: 31.4 pg (ref 26.0–34.0)
MCHC: 33.6 g/dL (ref 30.0–36.0)
MCV: 93.5 fL (ref 80.0–100.0)
Monocytes Absolute: 0.9 10*3/uL (ref 0.1–1.0)
Monocytes Relative: 10 %
Neutro Abs: 5.2 10*3/uL (ref 1.7–7.7)
Neutrophils Relative %: 61 %
Platelets: 307 10*3/uL (ref 150–400)
RBC: 5.09 MIL/uL (ref 4.22–5.81)
RDW: 11.6 % (ref 11.5–15.5)
WBC: 8.5 10*3/uL (ref 4.0–10.5)
nRBC: 0 % (ref 0.0–0.2)

## 2020-07-21 LAB — POCT URINALYSIS DIPSTICK, ED / UC
Bilirubin Urine: NEGATIVE
Glucose, UA: NEGATIVE mg/dL
Hgb urine dipstick: NEGATIVE
Ketones, ur: NEGATIVE mg/dL
Leukocytes,Ua: NEGATIVE
Nitrite: NEGATIVE
Protein, ur: NEGATIVE mg/dL
Specific Gravity, Urine: >=1.03 (ref 1.005–1.030)
Urobilinogen, UA: 1 mg/dL (ref 0.0–1.0)
pH: 6 (ref 5.0–8.0)

## 2020-07-21 MED ORDER — POLYETHYLENE GLYCOL 3350 17 G PO PACK: 17.0000 g | PACK | Freq: Every day | ORAL | 0 refills | Status: DC | PRN
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
## 2020-07-21 NOTE — Discharge Instructions (Addendum)
Take the MiraLAX daily as needed, can increase amount taken if needed to give soft, regular bowel movements.  Take a fiber supplement daily and make sure to be drinking plenty of fluids.  If your pain worsens or you begin to have other worsening symptoms go to the ER immediately

## 2020-07-21 NOTE — ED Provider Notes (Signed)
[facility]    CSN: [id number] Arrival date & time: 07/21/20  7048      History   Chief Complaint Chief Complaint  Patient presents with  . Abdominal Pain    HPI Bruce Gonzalez is a 35 y.o. male.   Patient presenting today with about a week of sharp nearly constant left lower quadrant pain that is worse with movement.  States his bowel movements have been less frequent since onset, and no bowel movement for the past 3 days which is very abnormal for him.  Is still passing gas.  Denies fever, chills, nausea, vomiting, urinary symptoms, melena, new foods or medications.  Took a stool softener yesterday which has not seemed to help much.  Of note, history of bleeding duodenal ulcer about 15 years ago with no issues since and he states this does not feel similar whatsoever.     History reviewed. No pertinent past medical history.  Patient Active Problem List   Diagnosis Date Noted  . ANEMIA, IRON DEFICIENCY 11/26/2009  . ACUT DUOD ULCER W/HEMORR W/O MENTION OBSTRUCTION 11/26/2009    History reviewed. No pertinent surgical history.     Home Medications    Prior to Admission medications   Medication Sig Start Date End Date Taking? Authorizing Provider  polyethylene glycol (MIRALAX) 17 g packet Take 17 g by mouth daily as needed. 07/21/20  Yes Particia Nearing, PA-C  tiZANidine (ZANAFLEX) 4 MG tablet Take 1 tablet (4 mg total) by mouth every 6 (six) hours as needed for muscle spasms. 04/15/20   Bast, Gloris Manchester A, NP  fluticasone (FLONASE) 50 MCG/ACT nasal spray Place 2 sprays into both nostrils daily. 08/31/17 04/15/20  Cathie Hoops, Amy V, PA-C  ipratropium (ATROVENT) 0.06 % nasal spray Place 2 sprays into both nostrils 4 (four) times daily. 08/31/17 04/15/20  Belinda Fisher, PA-C    Family History History reviewed. No pertinent family history.  Social History Social History   Tobacco Use  . Smoking status: Never Smoker  . Smokeless tobacco: Never Used  Substance Use Topics   . Alcohol use: Yes    Comment: occ  . Drug use: No     Allergies   Patient has no known allergies.   Review of Systems Review of Systems Per HPI  Physical Exam Triage Vital Signs ED Triage Vitals  Enc Vitals Group     BP 07/21/20 0934 119/84     Pulse Rate 07/21/20 0934 69     Resp 07/21/20 0934 18     Temp 07/21/20 0934 97.9 F (36.6 C)     Temp Source 07/21/20 0934 Oral     SpO2 07/21/20 0934 100 %     Weight --      Height --      Head Circumference --      Peak Flow --      Pain Score 07/21/20 0931 5     Pain Loc --      Pain Edu? --      Excl. in GC? --    No data found.  Updated Vital Signs BP 119/84 (BP Location: Left Arm)   Pulse 69   Temp 97.9 F (36.6 C) (Oral)   Resp 18   SpO2 100%   Visual Acuity Right Eye Distance:   Left Eye Distance:   Bilateral Distance:    Right Eye Near:   Left Eye Near:    Bilateral Near:     Physical Exam Vitals and nursing note  reviewed.  Constitutional:      Appearance: Normal appearance.  HENT:     Head: Atraumatic.     Mouth/Throat:     Mouth: Mucous membranes are moist.     Pharynx: Oropharynx is clear.  Eyes:     Extraocular Movements: Extraocular movements intact.     Conjunctiva/sclera: Conjunctivae normal.  Cardiovascular:     Rate and Rhythm: Normal rate and regular rhythm.  Pulmonary:     Effort: Pulmonary effort is normal.     Breath sounds: Normal breath sounds.  Abdominal:     General: Bowel sounds are normal. There is no distension.     Palpations: Abdomen is soft. There is no mass.     Tenderness: There is abdominal tenderness. There is no right CVA tenderness, left CVA tenderness, guarding or rebound.     Comments: left lower quadrant mild to moderately tender to palpation   Musculoskeletal:        General: Normal range of motion.     Cervical back: Normal range of motion and neck supple.  Skin:    General: Skin is warm and dry.  Neurological:     General: No focal deficit  present.     Mental Status: He is oriented to person, place, and time.  Psychiatric:        Mood and Affect: Mood normal.        Thought Content: Thought content normal.        Judgment: Judgment normal.      UC Treatments / Results  Labs (all labs ordered are listed, but only abnormal results are displayed) Labs Reviewed  CBC WITH DIFFERENTIAL/PLATELET  COMPREHENSIVE METABOLIC PANEL  POCT URINALYSIS DIPSTICK, ED / UC    EKG   Radiology No results found.  Procedures Procedures (including critical care time)  Medications Ordered in UC Medications - No data to display  Initial Impression / Assessment and Plan / UC Course  I have reviewed the triage vital signs and the nursing notes.  Pertinent labs & imaging results that were available during my care of the patient were reviewed by me and considered in my medical decision making (see chart for details).      Exam and vitals very reassuring today, unclear exactly what is causing his sharp left lower quadrant pain at this time.  UA completely benign.  Labs pending.  Not consistent with muscular pain per patient subjectively, possibly related to constipation.  Will trial daily fiber supplement, MiraLAX as needed push fluids.  Strict ED precautions given for acutely worsening symptoms.  Follow-up with PCP if not resolving  Final Clinical Impressions(s) / UC Diagnoses   Final diagnoses:  LLQ pain  Constipation, unspecified constipation type     Discharge Instructions     Take the MiraLAX daily as needed, can increase amount taken if needed to give soft, regular bowel movements.  Take a fiber supplement daily and make sure to be drinking plenty of fluids.  If your pain worsens or you begin to have other worsening symptoms go to the ER immediately    ED Prescriptions    Medication Sig Dispense Auth. Provider   polyethylene glycol (MIRALAX) 17 g packet Take 17 g by mouth daily as needed. 30 each Particia Nearing,  PA-C     PDMP not reviewed this encounter.   Particia Nearing, [place] 07/21/20 1240

## 2022-05-19 ENCOUNTER — Encounter (HOSPITAL_COMMUNITY): Payer: Self-pay

## 2022-05-19 ENCOUNTER — Telehealth (HOSPITAL_COMMUNITY): Payer: Self-pay

## 2022-05-19 ENCOUNTER — Ambulatory Visit (INDEPENDENT_AMBULATORY_CARE_PROVIDER_SITE_OTHER): Payer: Self-pay

## 2022-05-19 ENCOUNTER — Ambulatory Visit (HOSPITAL_COMMUNITY)
Admission: EM | Admit: 2022-05-19 | Discharge: 2022-05-19 | Disposition: A | Discharge status: Home / Self Care | Payer: Self-pay | Attending: Family Medicine | Admitting: Family Medicine

## 2022-05-19 DIAGNOSIS — R079 Chest pain, unspecified: Secondary | ICD-10-CM

## 2022-05-19 DIAGNOSIS — M940 Chondrocostal junction syndrome [Tietze]: Secondary | ICD-10-CM

## 2022-05-19 DIAGNOSIS — R0789 Other chest pain: Secondary | ICD-10-CM

## 2022-05-19 MED ORDER — PREDNISONE 20 MG PO TABS: 40.0000 mg | ORAL_TABLET | Freq: Every day | ORAL | 0 refills | Status: DC

## 2022-05-19 MED ORDER — DICLOFENAC SODIUM 75 MG PO TBEC: 75.0000 mg | DELAYED_RELEASE_TABLET | Freq: Two times a day (BID) | ORAL | 0 refills | Status: DC

## 2022-05-19 NOTE — ED Triage Notes (Signed)
Pt is here for chest pain and discomfort x 5days

## 2022-05-19 NOTE — ED Provider Notes (Signed)
[place]   [id number] 05/19/22 Arrival Time: 4098  ASSESSMENT & PLAN:  1. Chest wall pain   2. Costochondritis    Patient history and exam consistent with non-cardiac cause of chest pain.  I have personally viewed the imaging studies ordered this visit. CXR without acute changes; no pneumothorax.  Begin trial of: Meds ordered this encounter  Medications   diclofenac (VOLTAREN) 75 MG EC tablet    Sig: Take 1 tablet (75 mg total) by mouth 2 (two) times daily.    Dispense:  14 tablet    Refill:  0   predniSONE (DELTASONE) 20 MG tablet    Sig: Take 2 tablets (40 mg total) by mouth daily.    Dispense:  10 tablet    Refill:  0    Chest pain precautions given. Reviewed expectations re: course of current medical issues. Questions answered. Outlined signs and symptoms indicating need for more acute intervention. Patient verbalized understanding. After Visit Summary given.   SUBJECTIVE:  History from: patient. Bruce Gonzalez is a 37 y.o. male who presents with complaint of sternal CP; x 4-5 days; gradual onset. Works in [employer]; pressing down very frequently; questions relation. No chest trauma. Without associated n/v/SOB/diaphoresis. Certain movements exacerbation pain along with deep breaths at times. No back pain. No tx PTA.  Social History   Tobacco Use  Smoking Status Never  Smokeless Tobacco Never   Social History   Substance and Sexual Activity  Alcohol Use Yes   Comment: occ   OBJECTIVE:  Vitals:   05/19/22 1733  BP: 132/88  Pulse: 94  Resp: 18  Temp: 98.3 F (36.8 C)  TempSrc: Oral  SpO2: 98%    General appearance: alert, oriented, no acute distress Eyes: PERRLA; EOMI; conjunctivae normal HENT: normocephalic; atraumatic Neck: supple with FROM Lungs: without labored respirations; speaks full sentences without difficulty; CTAB Heart: regular rate and rhythm Chest Wall: with tenderness to palpation over LSB Abdomen: soft,  non-tender; no guarding or rebound tenderness Extremities: without edema; without calf swelling or tenderness; symmetrical without gross deformities Skin: warm and dry; without rash or lesions Neuro: normal gait Psychological: alert and cooperative; normal mood and affect   Imaging: DG Chest 2 View  Result Date: 05/19/2022 CLINICAL DATA:  Chest pain EXAM: CHEST - 2 VIEW COMPARISON:  05/08/2015 FINDINGS: Streaky atelectasis or scarring at the lingula. No consolidation or effusion. Normal cardiac size. No pneumothorax IMPRESSION: No active cardiopulmonary disease. Streaky atelectasis or scarring at the lingula. Electronically Signed   By: Donavan Foil M.D.   On: 05/19/2022 18:44     No Known Allergies  History reviewed. No pertinent past medical history. Social History   Socioeconomic History   Marital status: Single    Spouse name: Not on file   Number of children: Not on file   Years of education: Not on file   Highest education level: Not on file  Occupational History   Not on file  Tobacco Use   Smoking status: Never   Smokeless tobacco: Never  Substance and Sexual Activity   Alcohol use: Yes    Comment: occ   Drug use: No   Sexual activity: Yes    Birth control/protection: Condom  Other Topics Concern   Not on file  Social History Narrative   Not on file   Social Determinants of Health   Financial Resource Strain: Not on file  Food Insecurity: Not on file  Transportation Needs: Not on file  Physical Activity:  Not on file  Stress: Not on file  Social Connections: Not on file  Intimate Partner Violence: Not on file   History reviewed. No pertinent family history. History reviewed. No pertinent surgical history.    Vanessa Kick, MD 05/19/22 [place]

## 2023-08-29 ENCOUNTER — Encounter (HOSPITAL_COMMUNITY): Payer: Self-pay | Admitting: *Deleted

## 2023-08-29 ENCOUNTER — Ambulatory Visit (HOSPITAL_COMMUNITY)
Admission: EM | Admit: 2023-08-29 | Discharge: 2023-08-29 | Disposition: A | Discharge status: Home / Self Care | Payer: Self-pay | Attending: Family Medicine | Admitting: Family Medicine

## 2023-08-29 DIAGNOSIS — J029 Acute pharyngitis, unspecified: Secondary | ICD-10-CM | POA: Insufficient documentation

## 2023-08-29 LAB — POCT RAPID STREP A (OFFICE): Rapid Strep A Screen: NEGATIVE

## 2023-08-29 MED ORDER — KETOROLAC TROMETHAMINE 30 MG/ML IJ SOLN
30.0000 mg | Freq: Once | INTRAMUSCULAR | Status: AC
  Administered 2023-08-29: 30 mg via INTRAMUSCULAR

## 2023-08-29 MED ORDER — IBUPROFEN 800 MG PO TABS: 800.0000 mg | ORAL_TABLET | Freq: Three times a day (TID) | ORAL | 0 refills | Status: AC | PRN

## 2023-08-29 MED ORDER — KETOROLAC TROMETHAMINE 30 MG/ML IJ SOLN
INTRAMUSCULAR | Status: AC
  Filled 2023-08-29: qty 1

## 2023-08-29 NOTE — ED Triage Notes (Signed)
 Pt states he has had a sore throat since Friday. He took dayquil but states it was hard to swallow.

## 2023-08-29 NOTE — ED Provider Notes (Addendum)
 [facility]    CSN: [id number] Arrival date & time: 08/29/23  1306      History   Chief Complaint Chief Complaint  Patient presents with   Sore Throat    HPI Bruce Gonzalez is a 38 y.o. male.    Sore Throat  Here for sore throat that began on April 11 but then worsened last night.  No nasal congestion or cough.  No vomiting or diarrhea.  He did feel like he is maybe going to get a fever on April 10 but has not measured 1 since.  NKDA   History reviewed. No pertinent past medical history.  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 No pertinent surgical history.     Home Medications    Prior to Admission medications   Medication Sig Start Date End Date Taking? Authorizing Provider  ibuprofen (ADVIL) 800 MG tablet Take 1 tablet (800 mg total) by mouth every 8 (eight) hours as needed (pain). 08/29/23  Yes Zenia Resides, MD  fluticasone (FLONASE) 50 MCG/ACT nasal spray Place 2 sprays into both nostrils daily. 08/31/17 04/15/20  Cathie Hoops, Amy V, PA-C  ipratropium (ATROVENT) 0.06 % nasal spray Place 2 sprays into both nostrils 4 (four) times daily. 08/31/17 04/15/20  Belinda Fisher, PA-C    Family History History reviewed. No pertinent family history.  Social History Social History   Tobacco Use   Smoking status: Never   Smokeless tobacco: Never  Vaping Use   Vaping status: Never Used  Substance Use Topics   Alcohol use: Yes    Comment: occ   Drug use: No     Allergies   Patient has no known allergies.   Review of Systems Review of Systems   Physical Exam Triage Vital Signs ED Triage Vitals  Encounter Vitals Group     BP 08/29/23 1325 (!) 138/92     Systolic BP Percentile --      Diastolic BP Percentile --      Pulse Rate 08/29/23 1325 62     Resp 08/29/23 1325 18     Temp 08/29/23 1325 98.2 F (36.8 C)     Temp  Source 08/29/23 1325 Oral     SpO2 08/29/23 1325 97 %     Weight --      Height --      Head Circumference --      Peak Flow --      Pain Score 08/29/23 1324 5     Pain Loc --      Pain Education --      Exclude from Growth Chart --    No data found.  Updated Vital Signs BP (!) 138/92 (BP Location: Left Arm)   Pulse 62   Temp 98.2 F (36.8 C) (Oral)   Resp 18   SpO2 97%   Visual Acuity Right Eye Distance:   Left Eye Distance:   Bilateral Distance:    Right Eye Near:   Left Eye Near:    Bilateral Near:     Physical Exam Vitals reviewed.  Constitutional:      General: He is not in acute distress.    Appearance: He is not ill-appearing, toxic-appearing or diaphoretic.  HENT:     Right Ear: Tympanic membrane and ear canal normal.     Left Ear: Tympanic membrane and ear canal  normal.     Nose: Nose normal.     Mouth/Throat:     Mouth: Mucous membranes are moist.     Comments: There is erythema of the tonsils which are 2+ hypertrophy.  There is clear mucus in the tonsillar crypts. Eyes:     Extraocular Movements: Extraocular movements intact.     Conjunctiva/sclera: Conjunctivae normal.     Pupils: Pupils are equal, round, and reactive to light.  Cardiovascular:     Rate and Rhythm: Normal rate and regular rhythm.     Heart sounds: No murmur heard. Pulmonary:     Effort: Pulmonary effort is normal. No respiratory distress.     Breath sounds: Normal breath sounds. No stridor. No wheezing, rhonchi or rales.  Musculoskeletal:     Cervical back: Neck supple.  Lymphadenopathy:     Cervical: No cervical adenopathy.  Skin:    Capillary Refill: Capillary refill takes less than 2 seconds.     Coloration: Skin is not jaundiced or pale.  Neurological:     General: No focal deficit present.     Mental Status: He is alert and oriented to person, place, and time.  Psychiatric:        Behavior: Behavior normal.      UC Treatments / Results  Labs (all labs ordered are  listed, but only abnormal results are displayed) Labs Reviewed  CULTURE, GROUP A STREP [facility])  POCT RAPID STREP A (OFFICE)    EKG   Radiology No results found.  Procedures Procedures (including critical care time)  Medications Ordered in UC Medications  ketorolac (TORADOL) 30 MG/ML injection 30 mg (has no administration in time range)    Initial Impression / Assessment and Plan / UC Course  I have reviewed the triage vital signs and the nursing notes.  Pertinent labs & imaging results that were available during my care of the patient were reviewed by me and considered in my medical decision making (see chart for details).     Rapid strep is negative.  Throat culture is sent and we will notify and treat protocol if that is positive  I had ordered a PCR COVID swab, but he declines.  At first he declined Toradol injection but then at that discharge he stated he would like to try that for pain.  Toradol injections given here before he is discharged in the clinic.   Final Clinical Impressions(s) / UC Diagnoses   Final diagnoses:  Acute pharyngitis, unspecified etiology     Discharge Instructions      Your strep test is negative.  Culture of the throat will be sent, and staff will notify you if that is in turn positive.  Take ibuprofen 800 mg--1 tab every 8 hours as needed for pain.  Cepacol spray can help the pain.  Cold and soft foods will be tolerated better although your throat is bothering.       ED Prescriptions     Medication Sig Dispense Auth. Provider   ibuprofen (ADVIL) 800 MG tablet Take 1 tablet (800 mg total) by mouth every 8 (eight) hours as needed (pain). 21 tablet Derik Fults, Janace Aris, MD      PDMP not reviewed this encounter.   Zenia Resides, MD 08/29/23 1419    Zenia Resides, MD 08/29/23 [phone]

## 2023-08-29 NOTE — Discharge Instructions (Signed)
 Your strep test is negative.  Culture of the throat will be sent, and staff will notify you if that is in turn positive.  Take ibuprofen 800 mg--1 tab every 8 hours as needed for pain.  Cepacol spray can help the pain.  Cold and soft foods will be tolerated better although your throat is bothering.

## 2023-08-31 LAB — CULTURE, GROUP A STREP (THRC)
